# Patient Record
Sex: Female | Born: 1995 | Race: White | Hispanic: No | Smoking: Never smoker
Health system: Southern US, Community
[De-identification: ages and names within clinical notes are randomized; demographics above are authoritative.]

## PROBLEM LIST (undated history)

## (undated) DIAGNOSIS — K219 Gastro-esophageal reflux disease without esophagitis: Secondary | ICD-10-CM

## (undated) DIAGNOSIS — T7840XA Allergy, unspecified, initial encounter: Secondary | ICD-10-CM

## (undated) HISTORY — PX: MOUTH SURGERY: SHX715

## (undated) HISTORY — DX: Allergy, unspecified, initial encounter: T78.40XA

## (undated) HISTORY — PX: NO PAST SURGERIES: SHX2092

## (undated) HISTORY — DX: Gastro-esophageal reflux disease without esophagitis: K21.9

---
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
## 2018-01-02 IMAGING — US US ABDOMEN LIMITED
1 series · 14 of 25 positions shown · non-contrast
Comparison: None.

CLINICAL DATA: Right upper quadrant pain

EXAM:
US ABDOMEN LIMITED - RIGHT UPPER QUADRANT

[Series 1: us abdomen limited · 0.25mm/px · 14 of 38 slices shown]
[im 1/38]
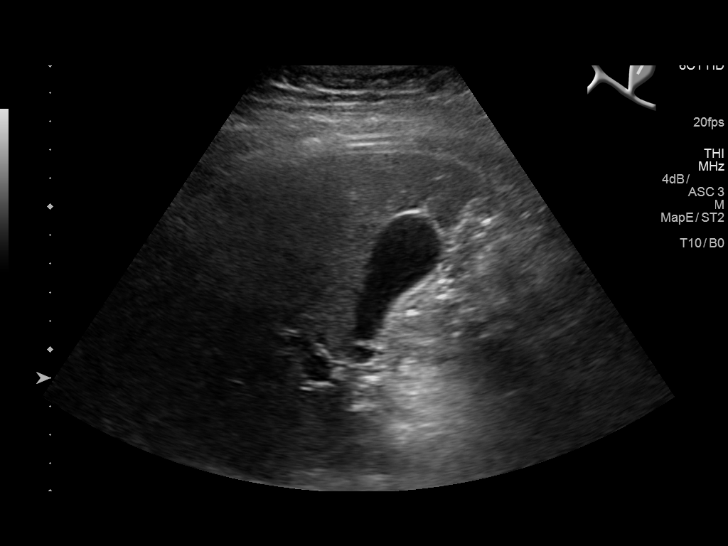
[im 4/38]
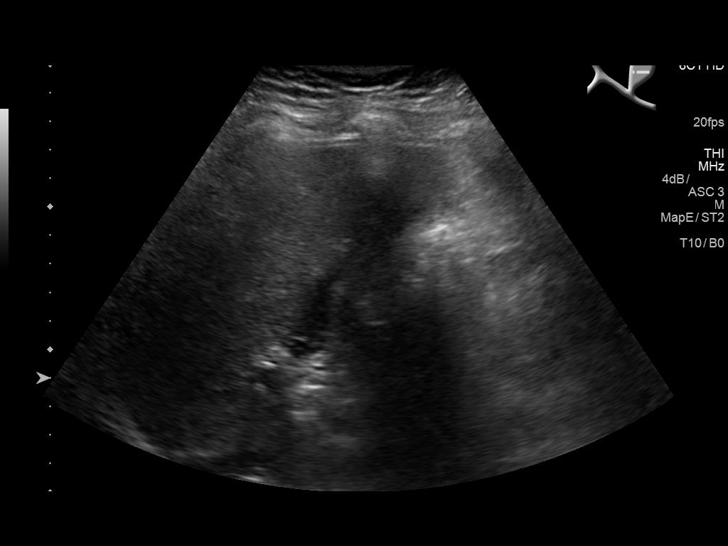
[im 7/38]
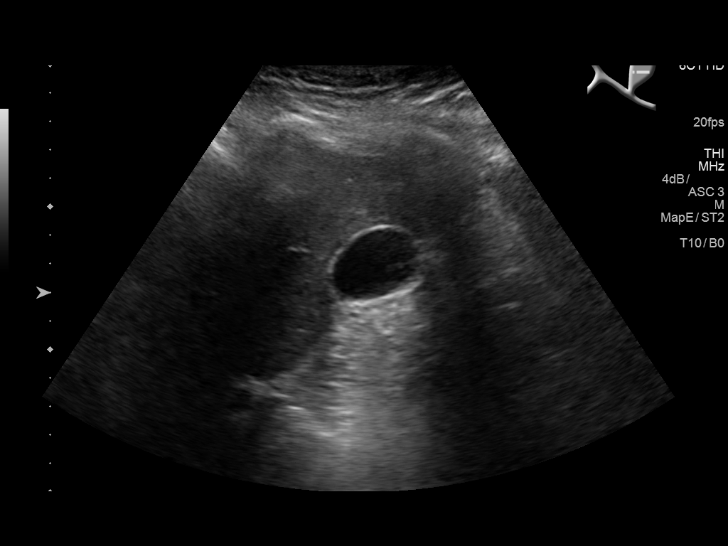
[im 10/38]
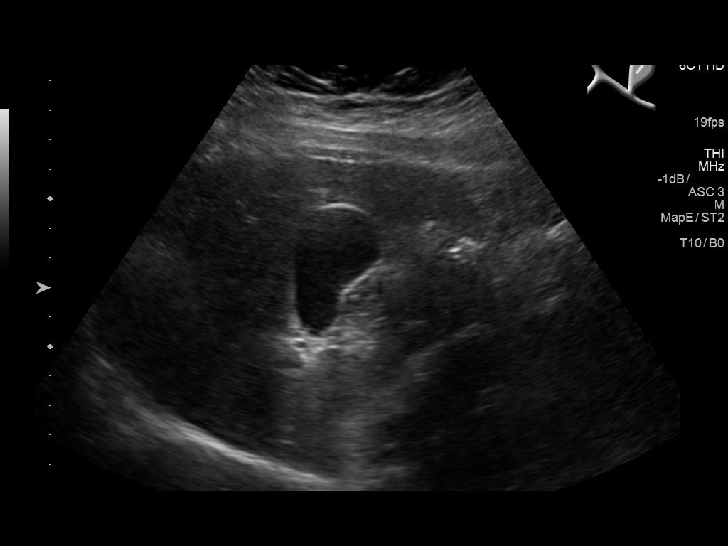
[im 13/38]
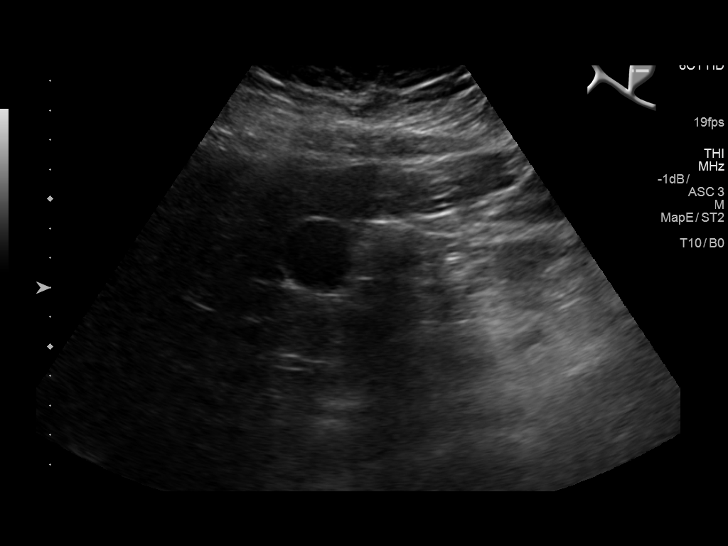
[im 14/38]
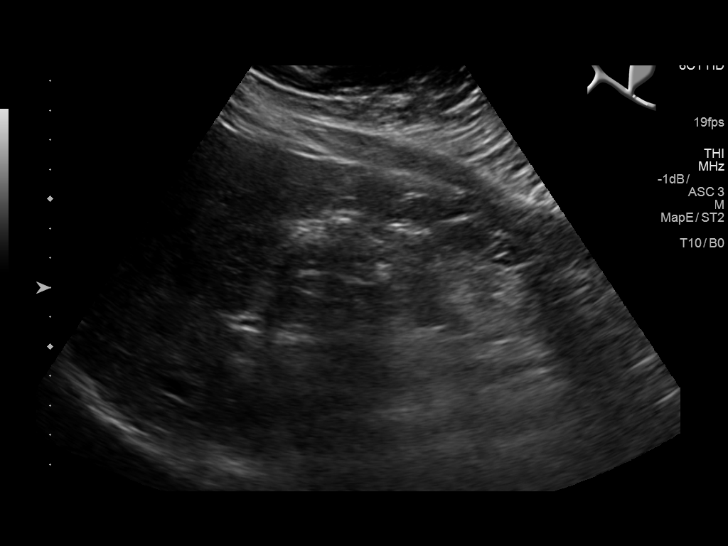
[im 17/38]
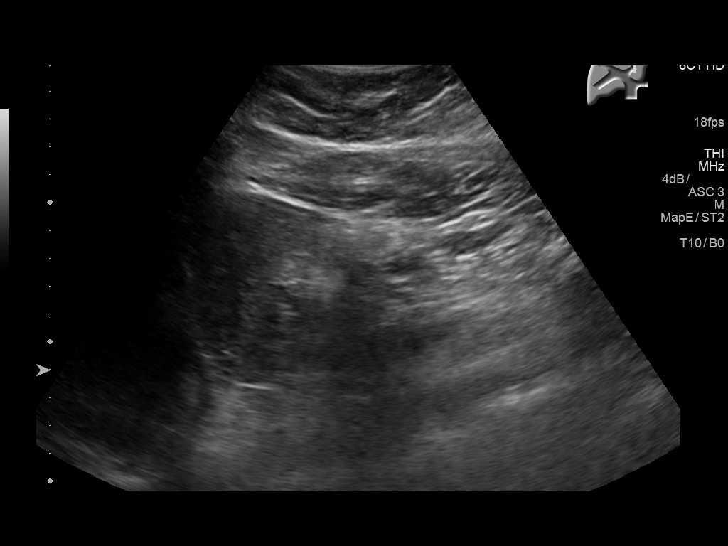
[im 21/38]
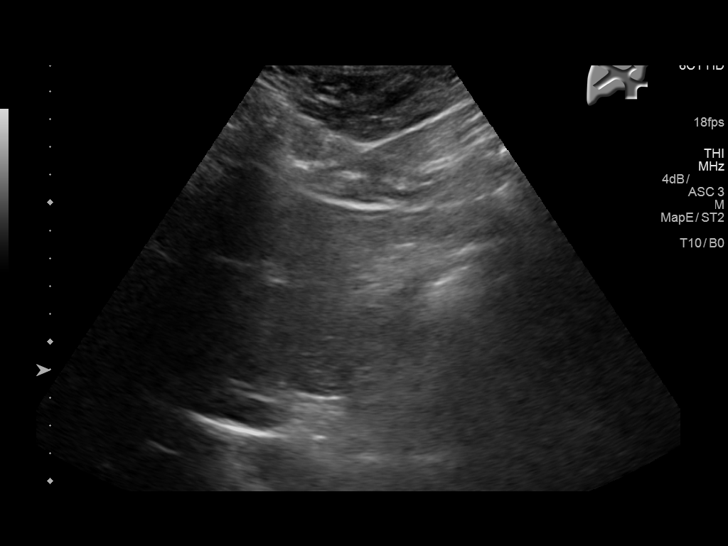
[im 24/38]
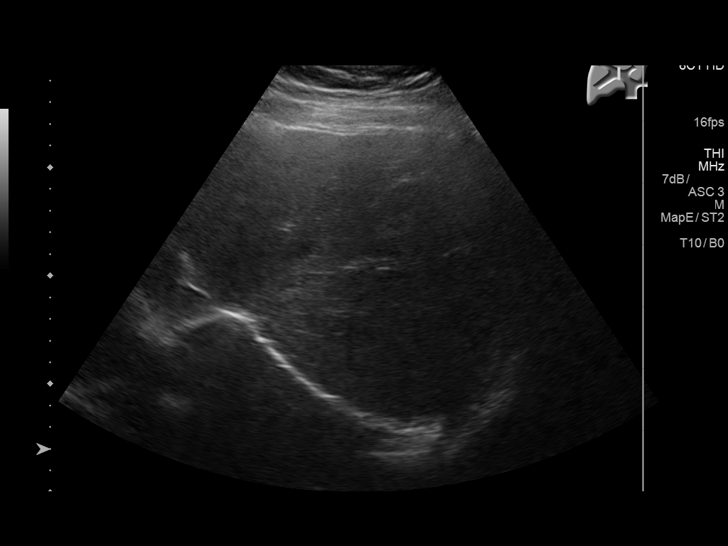
[im 25/38]
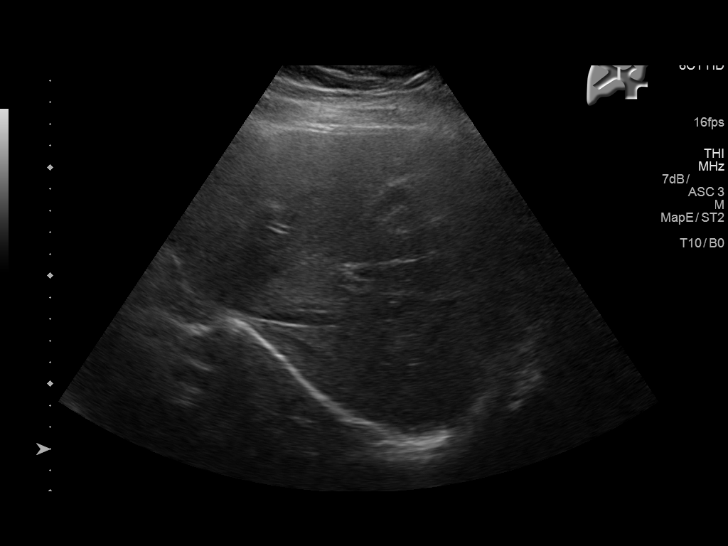
[im 28/38]
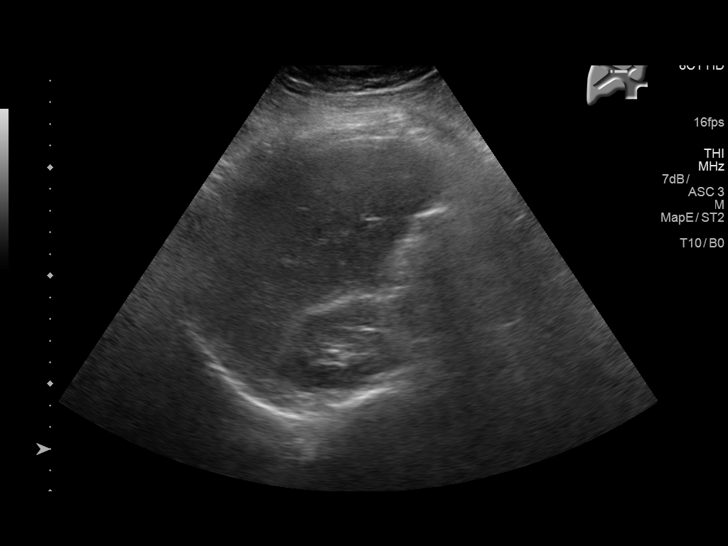
[im 31/38]
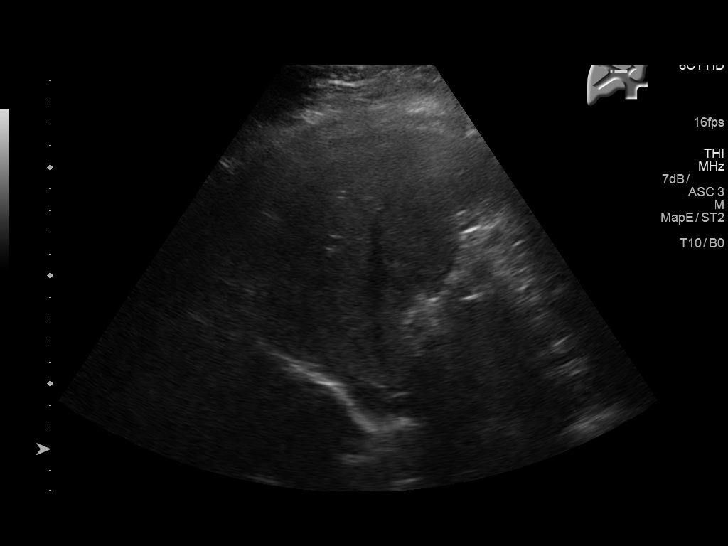
[im 34/38]
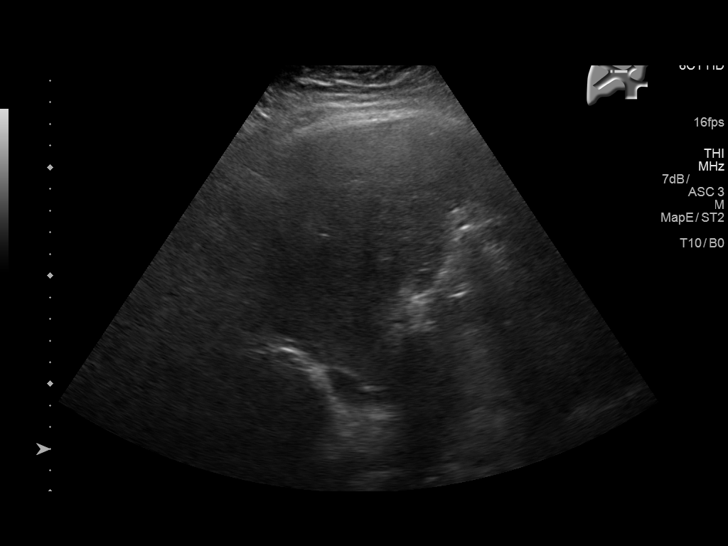
[im 38/38]
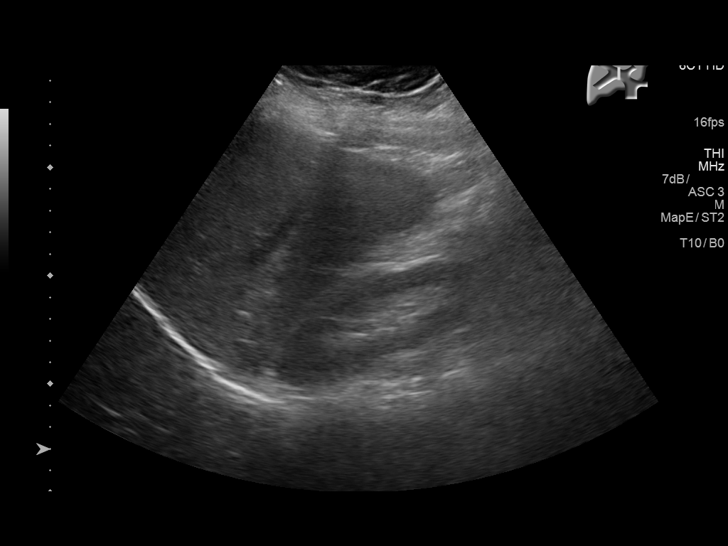

[14 of 25 positions shown; findings below may reference images not displayed]

FINDINGS: Gallbladder:

No gallstones or wall thickening visualized. No sonographic Murphy
sign noted.

Common bile duct:

Diameter: 3 mm

Liver:

Diffusely increased in echogenicity without focal mass.
IMPRESSION: Diffuse hepatic steatosis

Normal gallbladder and biliary tree.

## 2018-11-30 NOTE — L&D Delivery Note (Signed)
Date of delivery: 11/21/2019 Estimated Date of Delivery: 11/24/19 Patient's last menstrual period was 02/17/2019. EGA: [redacted]w[redacted]d  Delivery Note At 2:09 AM a viable and healthy female was delivered via Vaginal, Spontaneous.  Presentation: vertex; Position: Left,, Occiput,, Anterior;  Mom presented to L&D with labor, epidual placed, augmented with pitocin, AROMd for clear fluid. Progressed to complete, second stage: 2hrs, with delivery of fetal head with restitution to LOT.   Shoulder dystocia encountered, detailed below. Nuchal reduced,  Baby kept at perineum and then shortly after cord was clamped/cut and handed to peds for evaluation. Placenta spontaneously delivered, intact.   IV pitocin given for hemorrhage prophylaxis. No lacerations. A segment of hymen was hemostatic and dangling out of the vagina.  This was cut and a figure of eight was placed for hemostasis.  Delivery of the head: 11/21/2019  2:08 AM Tight nuchal, not reduced. First maneuver: 11/21/2019  2:08 AM, [place] Second maneuver: downward traction of head, then upward traction of head   Third maneuver: manipulation of posterior arm. Left arm rotated clockwise, which resulted in movement or anterior shoulder   APGAR: 5, 8; weight 9 lb 7.7 oz (4300 g).   Placenta status: spontaneous, intact.   Cord:  with the following complications: .  Cord pH: 7.35  Anesthesia: Epidural Episiotomy: None Lacerations: None Suture Repair: 3.0 vicryl Est. Blood Loss (mL): 65   We sang happy birthday to baby Ellen Castillo.   Mom to postpartum.  Baby to Couplet care / Skin to Skin.  Ellen Castillo 11/21/2019, 3:55 AM

## 2019-05-24 DIAGNOSIS — Z3403 Encounter for supervision of normal first pregnancy, third trimester: Secondary | ICD-10-CM | POA: Insufficient documentation

## 2019-05-30 LAB — OB RESULTS CONSOLE VARICELLA ZOSTER ANTIBODY, IGG: Varicella: IMMUNE

## 2019-05-30 LAB — OB RESULTS CONSOLE HEPATITIS B SURFACE ANTIGEN: Hepatitis B Surface Ag: NEGATIVE

## 2019-05-30 LAB — OB RESULTS CONSOLE RUBELLA ANTIBODY, IGM: Rubella: IMMUNE

## 2019-11-01 LAB — OB RESULTS CONSOLE GBS: GBS: NEGATIVE

## 2019-11-01 LAB — OB RESULTS CONSOLE RPR: RPR: NONREACTIVE

## 2019-11-01 LAB — OB RESULTS CONSOLE GC/CHLAMYDIA
Chlamydia: NEGATIVE
Gonorrhea: NEGATIVE

## 2019-11-01 LAB — OB RESULTS CONSOLE HIV ANTIBODY (ROUTINE TESTING): HIV: NONREACTIVE

## 2019-11-16 ENCOUNTER — Other Ambulatory Visit: Payer: Self-pay | Admitting: Certified Nurse Midwife

## 2019-11-16 ENCOUNTER — Encounter: Payer: Self-pay | Admitting: Certified Nurse Midwife

## 2019-11-16 NOTE — Progress Notes (Signed)
  Ellen Castillo is a 23 y.o. G1P0 female dated by LMP c/w [redacted]w[redacted]d ultrasound on 05/24/2019.    Pregnancy Issues: 1. Rh negative, s/p RhoGAM 08/31/2019 2. S>D at 24 weeks, growth ultrasound 11/01/2019 EFW 3306g 7lb5oz 73% 3. Elective induction  Prenatal care site: [facility]   Prenatal Labs: Blood type/Rh A neg  Antibody screen neg  Rubella Immune  Varicella Immune  RPR NR  HBsAg Neg  HIV NR  GC neg  Chlamydia neg  Genetic screening Negative, XY  1 hour GTT 100  3 hour GTT n/a  GBS negative   Post Partum Planning: - Infant feeding: breast - Contraception: OCPs

## 2019-11-20 ENCOUNTER — Inpatient Hospital Stay: Payer: No Typology Code available for payment source | Admitting: Registered Nurse

## 2019-11-20 ENCOUNTER — Inpatient Hospital Stay
Admission: EM | Admit: 2019-11-20 | Discharge: 2019-11-22 | DRG: 807 | Disposition: A | Discharge status: Home / Self Care | Payer: No Typology Code available for payment source | Attending: Obstetrics & Gynecology | Admitting: Obstetrics & Gynecology

## 2019-11-20 ENCOUNTER — Other Ambulatory Visit: Payer: Self-pay

## 2019-11-20 ENCOUNTER — Encounter: Payer: Self-pay | Admitting: Obstetrics & Gynecology

## 2019-11-20 DIAGNOSIS — O26893 Other specified pregnancy related conditions, third trimester: Secondary | ICD-10-CM | POA: Diagnosis present

## 2019-11-20 DIAGNOSIS — O3663X0 Maternal care for excessive fetal growth, third trimester, not applicable or unspecified: Secondary | ICD-10-CM | POA: Diagnosis present

## 2019-11-20 DIAGNOSIS — Z3A39 39 weeks gestation of pregnancy: Secondary | ICD-10-CM | POA: Diagnosis not present

## 2019-11-20 DIAGNOSIS — Z20828 Contact with and (suspected) exposure to other viral communicable diseases: Secondary | ICD-10-CM | POA: Diagnosis present

## 2019-11-20 LAB — CBC
HCT: 39.4 % (ref 36.0–46.0)
Hemoglobin: 13.8 g/dL (ref 12.0–15.0)
MCH: 31.7 pg (ref 26.0–34.0)
MCHC: 35 g/dL (ref 30.0–36.0)
MCV: 90.4 fL (ref 80.0–100.0)
Platelets: 226 10*3/uL (ref 150–400)
RBC: 4.36 MIL/uL (ref 3.87–5.11)
RDW: 12.4 % (ref 11.5–15.5)
WBC: 11.4 10*3/uL — ABNORMAL HIGH (ref 4.0–10.5)
nRBC: 0 % (ref 0.0–0.2)

## 2019-11-20 LAB — TYPE AND SCREEN
ABO/RH(D): A NEG
Antibody Screen: NEGATIVE

## 2019-11-20 LAB — RESPIRATORY PANEL BY RT PCR (FLU A&B, COVID)
Influenza A by PCR: NEGATIVE
Influenza B by PCR: NEGATIVE
SARS Coronavirus 2 by RT PCR: NEGATIVE

## 2019-11-20 LAB — ABO/RH: ABO/RH(D): A NEG

## 2019-11-20 MED ORDER — OXYTOCIN 40 UNITS IN NORMAL SALINE INFUSION - SIMPLE MED
2.5000 [IU]/h | INTRAVENOUS | Status: DC
  Filled 2019-11-20: qty 1000

## 2019-11-20 MED ORDER — FENTANYL 2.5 MCG/ML W/ROPIVACAINE 0.15% IN NS 100 ML EPIDURAL (ARMC)
EPIDURAL | Status: AC
  Filled 2019-11-20: qty 100

## 2019-11-20 MED ORDER — SOD CITRATE-CITRIC ACID 500-334 MG/5ML PO SOLN: 30.0000 mL | ORAL | Status: DC | PRN

## 2019-11-20 MED ORDER — LIDOCAINE HCL (PF) 1 % IJ SOLN: 30.0000 mL | INTRAMUSCULAR | Status: DC | PRN

## 2019-11-20 MED ORDER — BUPIVACAINE HCL (PF) 0.25 % IJ SOLN
INTRAMUSCULAR | Status: DC | PRN
  Administered 2019-11-20: 3 mL via EPIDURAL
  Administered 2019-11-20: 4 mL via EPIDURAL

## 2019-11-20 MED ORDER — MISOPROSTOL 25 MCG QUARTER TABLET: 25.0000 ug | ORAL_TABLET | ORAL | Status: DC | PRN

## 2019-11-20 MED ORDER — TERBUTALINE SULFATE 1 MG/ML IJ SOLN: 0.2500 mg | Freq: Once | INTRAMUSCULAR | Status: DC | PRN

## 2019-11-20 MED ORDER — LACTATED RINGERS IV SOLN
500.0000 mL | INTRAVENOUS | Status: DC | PRN
  Administered 2019-11-20: 500 mL via INTRAVENOUS

## 2019-11-20 MED ORDER — LACTATED RINGERS IV SOLN: INTRAVENOUS | Status: DC

## 2019-11-20 MED ORDER — EPHEDRINE 5 MG/ML INJ
INTRAVENOUS | Status: AC
  Filled 2019-11-20: qty 4

## 2019-11-20 MED ORDER — EPHEDRINE 5 MG/ML INJ
5.0000 mg | Freq: Once | INTRAVENOUS | Status: AC
  Administered 2019-11-20: 15:00:00 5 mg via INTRAVENOUS

## 2019-11-20 MED ORDER — ACETAMINOPHEN 325 MG PO TABS
650.0000 mg | ORAL_TABLET | ORAL | Status: DC | PRN
  Administered 2019-11-20: 650 mg via ORAL
  Filled 2019-11-20: qty 2

## 2019-11-20 MED ORDER — OXYTOCIN 10 UNIT/ML IJ SOLN
INTRAMUSCULAR | Status: AC
  Filled 2019-11-20: qty 2

## 2019-11-20 MED ORDER — LIDOCAINE HCL (PF) 1 % IJ SOLN
INTRAMUSCULAR | Status: DC | PRN
  Administered 2019-11-20: 3 mL

## 2019-11-20 MED ORDER — FENTANYL 2.5 MCG/ML W/ROPIVACAINE 0.15% IN NS 100 ML EPIDURAL (ARMC)
EPIDURAL | Status: DC | PRN
  Administered 2019-11-20: 12 mL/h via EPIDURAL

## 2019-11-20 MED ORDER — ONDANSETRON HCL 4 MG/2ML IJ SOLN: 4.0000 mg | Freq: Four times a day (QID) | INTRAMUSCULAR | Status: DC | PRN

## 2019-11-20 MED ORDER — OXYTOCIN 40 UNITS IN NORMAL SALINE INFUSION - SIMPLE MED
1.0000 m[IU]/min | INTRAVENOUS | Status: DC
  Administered 2019-11-20: 2 m[IU]/min via INTRAVENOUS

## 2019-11-20 MED ORDER — BUTORPHANOL TARTRATE 1 MG/ML IJ SOLN: 1.0000 mg | INTRAMUSCULAR | Status: DC | PRN

## 2019-11-20 MED ORDER — LIDOCAINE HCL (PF) 1 % IJ SOLN
INTRAMUSCULAR | Status: AC
  Filled 2019-11-20: qty 30

## 2019-11-20 MED ORDER — AMMONIA AROMATIC IN INHA
RESPIRATORY_TRACT | Status: AC
  Filled 2019-11-20: qty 10

## 2019-11-20 MED ORDER — MISOPROSTOL 200 MCG PO TABS
ORAL_TABLET | ORAL | Status: AC
  Filled 2019-11-20: qty 4

## 2019-11-20 MED ORDER — LIDOCAINE-EPINEPHRINE (PF) 1.5 %-1:200000 IJ SOLN
INTRAMUSCULAR | Status: DC | PRN
  Administered 2019-11-20: 3 mL via PERINEURAL

## 2019-11-20 MED ORDER — OXYTOCIN BOLUS FROM INFUSION
500.0000 mL | Freq: Once | INTRAVENOUS | Status: AC
  Administered 2019-11-21: 02:00:00 500 mL via INTRAVENOUS

## 2019-11-20 NOTE — OB Triage Note (Signed)
Pt is a 23 Y.o. G1P0 that presents from ED with c/o strong ctx since 10pm last night. Pt states she has lost her mucous plug and had some spotting overnight. Pt denies LOF and states positive FM. Monitors applied and initial FHT 150. Will continue to monitor.

## 2019-11-20 NOTE — Progress Notes (Signed)
MD notified of prolonged deceleration at 1430 and maternal BP 84/50 at 1432 and 94/49 at 1434. MD aware of interventions done (position change and bolus infusing from epidural) and that the anesthesiologist was notified at 1433 and gave order for ephedrine 5 mg to be given. MD notified that ephedrine was administered per order and that FHR is currently reactive.

## 2019-11-20 NOTE — Anesthesia Procedure Notes (Signed)
Epidural Patient location during procedure: OB Start time: 11/20/2019 2:07 PM End time: 11/20/2019 2:15 PM  Staffing Anesthesiologist: Gunnar Fusi, MD Resident/CRNA: Hedda Slade, CRNA Performed: resident/CRNA   Preanesthetic Checklist Completed: patient identified, IV checked, site marked, risks and benefits discussed, surgical consent, monitors and equipment checked, pre-op evaluation and timeout performed  Epidural Patient position: sitting Prep: ChloraPrep Patient monitoring: heart rate, continuous pulse ox and blood pressure Approach: midline Location: L3-L4 Injection technique: LOR saline  Needle:  Needle type: Tuohy  Needle gauge: 17 G Needle length: 9 cm and 9 Needle insertion depth: 8 cm Catheter type: closed end flexible Catheter size: 19 Gauge Catheter at skin depth: 13 cm Test dose: negative and 1.5% lidocaine with Epi 1:200 K  Assessment Events: blood not aspirated, injection not painful, no injection resistance, no paresthesia and negative IV test  Additional Notes 1 attempt Pt. Evaluated and documentation done after procedure finished. Patient identified. Risks/Benefits/Options discussed with patient including but not limited to bleeding, infection, nerve damage, paralysis, failed block, incomplete pain control, headache, blood pressure changes, nausea, vomiting, reactions to medication both or allergic, itching and postpartum back pain. Confirmed with bedside nurse the patient's most recent platelet count. Confirmed with patient that they are not currently taking any anticoagulation, have any bleeding history or any family history of bleeding disorders. Patient expressed understanding and wished to proceed. All questions were answered. Sterile technique was used throughout the entire procedure. Please see nursing notes for vital signs. Test dose was given through epidural catheter and negative prior to continuing to dose epidural or start infusion. Warning  signs of high block given to the patient including shortness of breath, tingling/numbness in hands, complete motor block, or any concerning symptoms with instructions to call for help. Patient was given instructions on fall risk and not to get out of bed. All questions and concerns addressed with instructions to call with any issues or inadequate analgesia.   Patient tolerated the insertion well without immediate complications.Reason for block:procedure for pain

## 2019-11-20 NOTE — Anesthesia Preprocedure Evaluation (Signed)
Anesthesia Evaluation  Patient identified by MRN, date of birth, ID band Patient awake    Reviewed: Allergy & Precautions, H&P , NPO status , Patient's Chart, lab work & pertinent test results  Airway Mallampati: II  TM Distance: >3 FB Neck ROM: full    Dental no notable dental hx.    Pulmonary    Pulmonary exam normal        Cardiovascular Normal cardiovascular exam     Neuro/Psych    GI/Hepatic GERD  Medicated and Controlled,  Endo/Other    Renal/GU      Musculoskeletal   Abdominal   Peds  Hematology   Anesthesia Other Findings   Reproductive/Obstetrics (+) Pregnancy                             Anesthesia Physical Anesthesia Plan  ASA: II  Anesthesia Plan:    Post-op Pain Management:    Induction:   PONV Risk Score and Plan:   Airway Management Planned:   Additional Equipment:   Intra-op Plan:   Post-operative Plan:   Informed Consent: I have reviewed the patients History and Physical, chart, labs and discussed the procedure including the risks, benefits and alternatives for the proposed anesthesia with the patient or authorized representative who has indicated his/her understanding and acceptance.     Dental Advisory Given  Plan Discussed with: Anesthesiologist and CRNA  Anesthesia Plan Comments:         Anesthesia Quick Evaluation

## 2019-11-21 ENCOUNTER — Encounter: Payer: Self-pay | Admitting: Obstetrics & Gynecology

## 2019-11-21 LAB — CBC
HCT: 35.8 % — ABNORMAL LOW (ref 36.0–46.0)
Hemoglobin: 12.5 g/dL (ref 12.0–15.0)
MCH: 32.1 pg (ref 26.0–34.0)
MCHC: 34.9 g/dL (ref 30.0–36.0)
MCV: 91.8 fL (ref 80.0–100.0)
Platelets: 186 10*3/uL (ref 150–400)
RBC: 3.9 MIL/uL (ref 3.87–5.11)
RDW: 12.6 % (ref 11.5–15.5)
WBC: 20.2 10*3/uL — ABNORMAL HIGH (ref 4.0–10.5)
nRBC: 0 % (ref 0.0–0.2)

## 2019-11-21 LAB — RPR: RPR Ser Ql: NONREACTIVE

## 2019-11-21 MED ORDER — SIMETHICONE 80 MG PO CHEW: 80.0000 mg | CHEWABLE_TABLET | ORAL | Status: DC | PRN

## 2019-11-21 MED ORDER — IBUPROFEN 600 MG PO TABS
600.0000 mg | ORAL_TABLET | Freq: Four times a day (QID) | ORAL | Status: DC
  Administered 2019-11-21 – 2019-11-22 (×6): 600 mg via ORAL
  Filled 2019-11-21 (×6): qty 1

## 2019-11-21 MED ORDER — VARICELLA VIRUS VACCINE LIVE 1350 PFU/0.5ML IJ SUSR
0.5000 mL | Freq: Once | INTRAMUSCULAR | Status: DC
  Filled 2019-11-21: qty 0.5

## 2019-11-21 MED ORDER — DIPHENHYDRAMINE HCL 25 MG PO CAPS: 25.0000 mg | ORAL_CAPSULE | Freq: Four times a day (QID) | ORAL | Status: DC | PRN

## 2019-11-21 MED ORDER — BENZOCAINE-MENTHOL 20-0.5 % EX AERO
1.0000 "application " | INHALATION_SPRAY | CUTANEOUS | Status: DC | PRN
  Administered 2019-11-21 – 2019-11-22 (×2): 1 via TOPICAL
  Filled 2019-11-21 (×2): qty 56

## 2019-11-21 MED ORDER — DOCUSATE SODIUM 100 MG PO CAPS
100.0000 mg | ORAL_CAPSULE | Freq: Two times a day (BID) | ORAL | Status: DC
  Administered 2019-11-21 – 2019-11-22 (×2): 100 mg via ORAL
  Filled 2019-11-21 (×2): qty 1

## 2019-11-21 MED ORDER — FENTANYL 2.5 MCG/ML W/ROPIVACAINE 0.15% IN NS 100 ML EPIDURAL (ARMC)
EPIDURAL | Status: AC
  Filled 2019-11-21: qty 100

## 2019-11-21 MED ORDER — PRENATAL MULTIVITAMIN CH
1.0000 | ORAL_TABLET | Freq: Every day | ORAL | Status: DC
  Filled 2019-11-21: qty 1

## 2019-11-21 MED ORDER — COCONUT OIL OIL
1.0000 "application " | TOPICAL_OIL | Status: DC | PRN
  Administered 2019-11-22: 1 via TOPICAL
  Filled 2019-11-21: qty 120

## 2019-11-21 MED ORDER — ACETAMINOPHEN 500 MG PO TABS
1000.0000 mg | ORAL_TABLET | Freq: Four times a day (QID) | ORAL | Status: DC | PRN
  Administered 2019-11-21 (×2): 1000 mg via ORAL
  Filled 2019-11-21 (×2): qty 2

## 2019-11-21 MED ORDER — ONDANSETRON HCL 4 MG PO TABS: 4.0000 mg | ORAL_TABLET | ORAL | Status: DC | PRN

## 2019-11-21 MED ORDER — ONDANSETRON HCL 4 MG/2ML IJ SOLN: 4.0000 mg | INTRAMUSCULAR | Status: DC | PRN

## 2019-11-21 MED ORDER — WITCH HAZEL-GLYCERIN EX PADS: 1.0000 "application " | MEDICATED_PAD | CUTANEOUS | Status: DC

## 2019-11-21 MED ORDER — OXYTOCIN 40 UNITS IN NORMAL SALINE INFUSION - SIMPLE MED
INTRAVENOUS | Status: AC
  Filled 2019-11-21: qty 1000

## 2019-11-21 MED ORDER — DIBUCAINE (PERIANAL) 1 % EX OINT: 1.0000 "application " | TOPICAL_OINTMENT | CUTANEOUS | Status: DC | PRN

## 2019-11-21 NOTE — Lactation Note (Signed)
This note was copied from a baby's chart. Lactation Consultation Note  Patient Name: Ellen Castillo [id number] Date: 11/21/2019 Reason for consult: Initial assessment;1st time breastfeeding;Term;Other (Comment)(shoulder dystosia )  LC walked into the room and Mom was in the bed nursing baby at the left breast. FOB was sleeping in the recliner. [place] asked how things were going so far with breastfeeding. Mom reported that things were going well. She said baby was nursing for the second time since birth. LC observed the latch/ technique and praised Mom for her efforts.  Mom asked great questions as she is a first time mom. Breast feeding basics were covered as well as typical behaviors in the first day of life, second day of life, and cluster feeding.   [facility] intern stayed to cover any other questions or concerns Mom had in regards to breastfeeding. Mom had additional questions about pumping (when to) and offering bottles and pacifier. [facility] intern encouraged Mom to make sure breastfeeding is well established before offering artifical nipples.   As far as pumping, Mom reported that she won't be returning to work any time soon but wanted to know when pumping is appropriate. Unless necessary, pumping can be delayed until breastfeeding is well established; however, when pumping always offer baby the breast first then pump. When pumping for excess or storage it helps if you pump on a schedule to get the body in a rhythm. Mom was praised for efforts and was encouraged to call out for assistance if needed.  Maternal Data Formula Feeding for Exclusion: No Has patient been taught Hand Expression?: Yes Does the patient have breastfeeding experience prior to this delivery?: No  Feeding Feeding Type: Breast Fed  LATCH Score Latch: Repeated attempts needed to sustain latch, nipple held in mouth throughout feeding, stimulation needed to elicit sucking reflex.  Audible Swallowing: None  Type of Nipple: Everted  at rest and after stimulation  Comfort (Breast/Nipple): Soft / non-tender  Hold (Positioning): No assistance needed to correctly position infant at breast.  LATCH Score: 7  Interventions Interventions: Breast feeding basics reviewed;Assisted with latch;Hand express;Breast compression  Lactation Tools Discussed/Used     Consult Status Consult Status: Follow-up Date: 11/21/19 Follow-up type: In-patient    Lavonia Drafts 11/21/2019, 1:48 PM

## 2019-11-21 NOTE — Anesthesia Postprocedure Evaluation (Deleted)
Anesthesia Post Note  Patient: Ellen Castillo  Procedure(s) Performed: AN AD HOC LABOR EPIDURAL  Patient location during evaluation: Mother Baby Anesthesia Type: Epidural Level of consciousness: awake and alert Pain management: pain level controlled Vital Signs Assessment: post-procedure vital signs reviewed and stable Respiratory status: spontaneous breathing, nonlabored ventilation and respiratory function stable Cardiovascular status: stable Postop Assessment: no headache, no backache and epidural receding Anesthetic complications: no     Last Vitals:  Vitals:   11/21/19 0430 11/21/19 0457  BP:  113/78  Pulse:  (!) 102  Resp:  16  Temp:  37.3 C  SpO2: 96% 95%    Last Pain:  Vitals:   11/21/19 0526  TempSrc:   PainSc: 3                  Caryl Asp

## 2019-11-21 NOTE — Lactation Note (Signed)
This note was copied from a baby's chart. Lactation Consultation Note  Patient Name: Ellen Castillo [id number] Date: 11/21/2019 Reason for consult: Initial assessment;1st time breastfeeding;Term;Other (Comment)(shoulder dystosia )  [place] intern walked in the room and Mom was in bed eating, Dad was in the recliner. Baby was sleeping in the crib. [facility] intern asked how things were going since our last visit. Mom reported things were going well. Baby nursed on both breast for 40 minutes total, 20 minutes each.  [facility] intern encouraged Mom to continue to watch for early feeding cues as baby will be approaching the 24 hour mark soon and will begin cluster feeding.  Mom seemed confident and was encouraged to call out for assistance if needed.   Maternal Data Formula Feeding for Exclusion: No Has patient been taught Hand Expression?: Yes Does the patient have breastfeeding experience prior to this delivery?: No  Feeding Feeding Type: Breast Fed  LATCH Score Latch: Repeated attempts needed to sustain latch, nipple held in mouth throughout feeding, stimulation needed to elicit sucking reflex.  Audible Swallowing: None  Type of Nipple: Everted at rest and after stimulation  Comfort (Breast/Nipple): Soft / non-tender  Hold (Positioning): No assistance needed to correctly position infant at breast.  LATCH Score: 7  Interventions Interventions: Breast feeding basics reviewed;Assisted with latch;Hand express;Breast compression  Lactation Tools Discussed/Used     Consult Status Consult Status: Follow-up Date: 11/21/19 Follow-up type: In-patient    Lavonia Drafts 11/21/2019, 4:02 PM

## 2019-11-22 MED ORDER — IBUPROFEN 600 MG PO TABS: 600.0000 mg | ORAL_TABLET | Freq: Four times a day (QID) | ORAL | 0 refills | Status: DC

## 2019-11-22 MED ORDER — ACETAMINOPHEN 500 MG PO TABS: 1000.0000 mg | ORAL_TABLET | Freq: Four times a day (QID) | ORAL | 0 refills | Status: DC | PRN

## 2019-11-22 NOTE — Progress Notes (Signed)
Discharge order received from doctor. Reviewed discharge instructions and prescriptions with patient and answered all questions. Follow up appointment instructions given. Patient verbalized understanding. ID bands checked. Patient discharged home with infant via wheelchair by nursing/auxillary.    Oaklyn Jakubek Garner, RN  

## 2019-11-22 NOTE — Discharge Summary (Signed)
Obstetrical Discharge Summary  Patient Name: Ellen Castillo DOB: 02/27/1996 MRN: [id number]  Date of Admission: 11/20/2019 Date of Delivery: 11/21/2019 Delivered by: Ranae Plumber MD Date of Discharge: 11/22/2019  Primary OB: [facility]   [id number] last menstrual period was 02/17/2019. EDC Estimated Date of Delivery: 11/24/19 Gestational Age at Delivery: [redacted]w[redacted]d   Antepartum complications:  Admitting Diagnosis: labor Secondary Diagnosis: Patient Active Problem List   Diagnosis Date Noted  . Labor and delivery indication for care or intervention 11/20/2019    Augmentation: AROM and Pitocin Complications: None Intrapartum complications/course:  Delivery Type: spontaneous vaginal delivery shoulder dystocia, macrosomia Anesthesia: epidural Placenta: spontaneous Laceration: none Episiotomy: none Newborn Data: Live born female "Madaline Guthrie" Birth Weight: 9 lb 7.7 oz (4300 g) APGAR: 5, 8  Newborn Delivery   Birth date/time: 11/21/2019 02:09:00 Delivery type: Vaginal, Spontaneous      Postpartum Procedures: none  Post partum course:  Patient had an uncomplicated postpartum course.  By time of discharge on PPD#1, her pain was controlled on oral pain medications; she had appropriate lochia and was ambulating, voiding without difficulty and tolerating regular diet.  She was deemed stable for discharge to home.     Discharge Physical Exam:  BP 122/88 (BP Location: Right Arm)   Pulse 65   Temp 97.6 F (36.4 C) (Oral)   Resp 20   Ht 5\' 4"  (1.626 m)   Wt 93.9 kg   LMP 02/17/2019   SpO2 100% Comment: Room Air  Breastfeeding Unknown   BMI 35.53 kg/m   General: NAD CV: RRR Pulm: CTABL, nl effort ABD: s/nd/nt, fundus firm and below the umbilicus Lochia: moderate DVT Evaluation: LE non-ttp, no evidence of DVT on exam.  Hemoglobin  Date Value Ref Range Status  11/21/2019 12.5 12.0 - 15.0 g/dL Final   HCT  Date Value Ref Range Status  11/21/2019 35.8 (L)  36.0 - 46.0 % Final     Disposition: stable, discharge to home. Baby Feeding: breastmilk  Baby Disposition: home with mom  Rh Immune globulin given: n/a Rubella vaccine given: n/a Flu vaccine given in AP or PP setting: AP  Contraception: OCPs  Prenatal Labs:     Plan:  BERNADENE GARSIDE was discharged to home in good condition. Follow-up appointment with Dr. Karna Dupes in 6 weeks.  Discharge Medications: Allergies as of 11/22/2019   No Known Allergies     Medication List    STOP taking these medications   esomeprazole 40 MG capsule Commonly known as: NEXIUM   fexofenadine-pseudoephedrine 180-240 MG 24 hr tablet Commonly known as: Allegra-D Allergy & Congestion   ondansetron 8 MG disintegrating tablet Commonly known as: Zofran ODT   sucralfate 1 g tablet Commonly known as: Carafate     TAKE these medications   acetaminophen 500 MG tablet Commonly known as: TYLENOL Take 2 tablets (1,000 mg total) by mouth every 6 (six) hours as needed (for pain scale < 4).   ibuprofen 600 MG tablet Commonly known as: ADVIL Take 1 tablet (600 mg total) by mouth every 6 (six) hours. Notes to patient: 11/22/19 @ 6:00 PM   prenatal multivitamin Tabs tablet Take 1 tablet by mouth daily at 12 noon.       Follow-up Information    Ward, 11/24/19, MD. Schedule an appointment as soon as possible for a visit in 6 week(s).   Specialty: Obstetrics and Gynecology Why: Please call to schedule a 6 week postpartum follow up appointment with Dr. Elenora Fender information: [address]  [place] [address] [phone]           Signed: Francetta Found, CNM 11/22/2019 12:57 PM

## 2019-11-22 NOTE — Anesthesia Postprocedure Evaluation (Signed)
Anesthesia Post Note  Patient: Ellen Castillo  Procedure(s) Performed: AN AD HOC LABOR EPIDURAL  Patient location during evaluation: Mother Baby Anesthesia Type: Epidural Level of consciousness: awake and alert Pain management: pain level controlled Vital Signs Assessment: post-procedure vital signs reviewed and stable Respiratory status: spontaneous breathing, nonlabored ventilation and respiratory function stable Cardiovascular status: stable Postop Assessment: no headache, no backache and epidural receding Anesthetic complications: no     Last Vitals:  Vitals:   11/22/19 0008 11/22/19 0746  BP: 119/85 122/88  Pulse: 60 65  Resp: 18 20  Temp: 36.5 C 36.4 C  SpO2: 98% 100%    Last Pain:  Vitals:   11/22/19 0746  TempSrc: Oral  PainSc:                  Hedda Slade

## 2019-11-22 NOTE — Discharge Instructions (Signed)

## 2019-11-22 NOTE — Progress Notes (Signed)
Post Partum Day 1 Subjective: Doing well, no complaints.  Tolerating regular diet, pain with PO meds, voiding and ambulating without difficulty.  No CP SOB Fever,Chills, N/V or leg pain; denies nipple or breast pain, no HA change of vision, RUQ/epigastric pain  Objective: BP 122/88 (BP Location: Right Arm)   Pulse 65   Temp 97.6 F (36.4 C) (Oral)   Resp 20   Ht 5\' 4"  (1.626 m)   Wt 93.9 kg   LMP 02/17/2019   SpO2 100% Comment: Room Air  Breastfeeding Unknown   BMI 35.53 kg/m    Physical Exam:  General: NAD Breasts: soft/nontender CV: RRR Pulm: nl effort, CTABL Abdomen: soft, NT, BS x 4 Perineum: minimal edema, intact, repair of hymen tag hemostatic  Lochia: small Uterine Fundus: fundus firm and 1 fb below umbilicus DVT Evaluation: no cords, ttp LEs   Recent Labs    11/20/19 1017 11/21/19 1331  HGB 13.8 12.5  HCT 39.4 35.8*  WBC 11.4* 20.2*  PLT 226 186    Assessment/Plan: 23 y.o. G1P1001 postpartum day # 1  - Continue routine PP care - Lactation consult.  - Discussed contraceptive options including implant, IUDs hormonal and non-hormonal, injection, pills/ring/patch, condoms, and NFP. Plans OCPs at 6wks PP.  - Immunization status: all Imms up to date    Disposition: Does desire Dc home today.     Francetta Found, CNM 11/22/2019  8:25 AM

## 2019-11-22 NOTE — Lactation Note (Signed)
This note was copied from a baby's chart. Lactation Consultation Note  Patient Name: Ellen Castillo [id number] Date: 11/22/2019 Reason for consult: Follow-up assessment;1st time breastfeeding;Term  LC in to see mom and baby before discharge. Dad was holding and bouncing baby, reporting gas and discomfort since last feeding. Mom reported an increase in feeding frequency overnight, but still unsure if it is enough. Mom has no pain or discomfort noted with breastfeeding, and says baby stays active when at the breast. Parents had questions about initiation of pumping, pacifier use, and signs baby was getting enough. [place] address parents questions: Encouraged mom to focus on feedings at the breast until well established if possible, at least for the next 2-3 weeks before implementing pumping; explaining that this may become overwhelming too soon if not need/necessary. LC discussed risk factors to breastfeeding associated with pacifier introduction/use, encouraging other ways to soothe the baby. LC also reviewed signs of adequate milk transfer- baby's body language, tracking wet/stool diapers, changes in diaper and clothing sizes, and time between feedings. Provided education of breastfeeding basics for the days-weeks to come: reviewed newborn stomach size and growth, growth spurts and cluster feeding, importance of putting breast on cue, tracking of wet/stool diapers, including color of stool. Discussed milk supply and demand, onset of transitional and mature milk, transient nipple pain/soreness, breast fullness, engorgement, and signs of mastitis, and overall breast care. Information given for outpatient lactation consult services and community breastfeeding support resources. Encouraged to call out for any breastfeeding questions, concerns, or support before discharge today.  Maternal Data Formula Feeding for Exclusion: No Has patient been taught Hand Expression?: Yes Does the patient have  breastfeeding experience prior to this delivery?: No  Feeding    LATCH Score                   Interventions Interventions: Breast feeding basics reviewed  Lactation Tools Discussed/Used     Consult Status Consult Status: Complete Date: 11/22/19 Follow-up type: Call as needed    Lavonia Drafts 11/22/2019, 10:23 AM

## 2019-11-22 NOTE — Addendum Note (Signed)
Addendum  created 11/22/19 0926 by Hedda Slade, CRNA   Clinical Note Signed

## 2019-12-08 NOTE — H&P (Signed)
OB History & Physical   History of Present Illness:  Chief Complaint:   HPI:  Ellen Castillo is a 24 y.o. G60P1001 female at [redacted]w[redacted]d dated by LMP. Patient's last menstrual period was 02/17/2019. Estimated Date of Delivery: 11/24/19  She presents to L&D with worsening painful contractions.   +FM, + CTX, no LOF, no VB  Pregnancy Issues: 1. Rh negative, s/p rhogam  Maternal Medical History:   Past Medical History:  Diagnosis Date  . Allergy   . GERD (gastroesophageal reflux disease)     Past Surgical History:  Procedure Laterality Date  . MOUTH SURGERY    . NO PAST SURGERIES      No Known Allergies  Prior to Admission medications   Medication Sig Start Date End Date Taking? Authorizing Provider  Prenatal Vit-Fe Fumarate-FA (PRENATAL MULTIVITAMIN) TABS tablet Take 1 tablet by mouth daily at 12 noon.   Yes [provider]  acetaminophen (TYLENOL) 500 MG tablet Take 2 tablets (1,000 mg total) by mouth every 6 (six) hours as needed (for pain scale < 4). 11/22/19   McVey, Prudencio Pair, CNM  ibuprofen (ADVIL) 600 MG tablet Take 1 tablet (600 mg total) by mouth every 6 (six) hours. 11/22/19   McVey, Prudencio Pair, CNM     Prenatal care site: [facility]   Social History: She  reports that she has never smoked. She has never used smokeless tobacco. She reports that she does not drink alcohol or use drugs.  Family History: family history includes Hyperlipidemia in her father.   Review of Systems: A full review of systems was performed and negative except as noted in the HPI.     Physical Exam:  Vital Signs: BP 122/88 (BP Location: Right Arm)   Pulse 65   Temp 97.6 F (36.4 C) (Oral)   Resp 20   Ht 5\' 4"  (1.626 m)   Wt 93.9 kg   LMP 02/17/2019   SpO2 100% Comment: Room Air  Breastfeeding Unknown   BMI 35.53 kg/m  General: no acute distress.  HEENT: normocephalic, atraumatic Heart: regular rate & rhythm.  No murmurs/rubs/gallops Lungs: clear to  auscultation bilaterally, normal respiratory effort Abdomen: soft, gravid, non-tender;  EFW: 8.5lbs Pelvic:   External: Normal external female genitalia   SVE: 3/90/-2 with change to 5/100/-2 with bulging bag   Extremities: non-tender, symmetric, 2+edema bilaterally.  DTRs: 2+ Neurologic: Alert & oriented x 3.     Pertinent Results:  Prenatal Labs: Blood type/Rh A negative  Antibody screen neg  Rubella Immune  Varicella Immune  RPR NR  HBsAg Neg  HIV NR  GC neg  Chlamydia neg  Genetic screening negative  1 hour GTT 100  3 hour GTT --  GBS negative   FHT: 145 mod + accels no decels TOCO: q2-39min   Cephalic by leopolds  Assessment:  Ellen Castillo is a 24 y.o. G40P1001 female at [redacted]w[redacted]d with labor.   Plan:  1. Admit to Labor & Delivery 2. CBC, T&S, Clrs, IVF 3. GBS  negative 4. Consents obtained. 5. Continuous efm/toco 6. Expectant management 7. category1 tracing 8. Epidural if desired  ----- [redacted]w[redacted]d, MD Attending Obstetrician and Gynecologist [facility], Department of OB/GYN [facility]

## 2020-03-26 ENCOUNTER — Ambulatory Visit (INDEPENDENT_AMBULATORY_CARE_PROVIDER_SITE_OTHER): Payer: No Typology Code available for payment source | Admitting: Podiatry

## 2020-03-26 ENCOUNTER — Other Ambulatory Visit: Payer: Self-pay

## 2020-03-26 DIAGNOSIS — B351 Tinea unguium: Secondary | ICD-10-CM | POA: Diagnosis not present

## 2020-03-27 ENCOUNTER — Encounter: Payer: Self-pay | Admitting: Podiatry

## 2020-03-27 NOTE — Progress Notes (Signed)
Subjective:  Patient ID: Ellen Castillo, female    DOB: [birth year],  MRN: [id number]  Chief Complaint  Patient presents with  . Nail Problem    pt is here for bil fungal nails, which has been going on for aboput 4 years, pt states that it is not painful, but is looking to get it looked at.    24 y.o. female presents with the above complaint.  Patient presents with thickened elongated dystrophic toenails x10.  They are mildly painful in nature.  Patient states been going about 4 years has progressively gotten worse.  She let them go for a while and would like to now discuss various treatment options for this.  She states that she has not tried anything over-the-counter for them.  She denies seeing anyone else for this prior to seeing me.  Her pain scale is 1 out of 10.  It is more dull achy in nature.  She would like to discuss various treatment options for onychomycosis.   Review of Systems: Negative except as noted in the HPI. Denies N/V/F/Ch.  Past Medical History:  Diagnosis Date  . Allergy   . GERD (gastroesophageal reflux disease)     Current Outpatient Medications:  .  acetaminophen (TYLENOL) 500 MG tablet, Take 2 tablets (1,000 mg total) by mouth every 6 (six) hours as needed (for pain scale < 4)., Disp: 30 tablet, Rfl: 0 .  ibuprofen (ADVIL) 600 MG tablet, Take 1 tablet (600 mg total) by mouth every 6 (six) hours., Disp: 30 tablet, Rfl: 0 .  omeprazole (PRILOSEC) 20 MG capsule, Take by mouth., Disp: , Rfl:  .  ondansetron (ZOFRAN-ODT) 4 MG disintegrating tablet, , Disp: , Rfl:  .  Prenat-FeFum-DSS-FA-DHA w/o A (PNV-DHA+DOCUSATE) 27-1.25-300 MG CAPS, Take by mouth., Disp: , Rfl:  .  Prenatal Vit-Fe Fumarate-FA (PRENATAL MULTIVITAMIN) TABS tablet, Take 1 tablet by mouth daily at 12 noon., Disp: , Rfl:  .  pyridOXINE (VITAMIN B-6) 25 MG tablet, Take by mouth., Disp: , Rfl:  .  sertraline (ZOLOFT) 50 MG tablet, Take 50 mg by mouth daily., Disp: , Rfl:  .  simethicone (MYLICON) 80  MG chewable tablet, Chew by mouth., Disp: , Rfl:  .  terconazole (TERAZOL 7) 0.4 % vaginal cream, Place vaginally., Disp: , Rfl:   Social History   Tobacco Use  Smoking Status Never Smoker  Smokeless Tobacco Never Used    No Known Allergies Objective:  There were no vitals filed for this visit. There is no height or weight on file to calculate BMI. Constitutional Well developed. Well nourished.  Vascular Dorsalis pedis pulses palpable bilaterally. Posterior tibial pulses palpable bilaterally. Capillary refill normal to all digits.  No cyanosis or clubbing noted. Pedal hair growth normal.  Neurologic Normal speech. Oriented to person, place, and time. Epicritic sensation to light touch grossly present bilaterally.  Dermatologic  thickened elongated dystrophic mycotic mildly painful toenails x10 Skin within normal limits.  No skin lesions.  Orthopedic: Normal joint ROM without pain or crepitus bilaterally. No visible deformities. No bony tenderness.   Radiographs: None Assessment:   1. Nail fungus   2. Onychomycosis due to dermatophyte    Plan:  Patient was evaluated and treated and all questions answered.  Bilateral onychomycosis of all the digits x10 -Educated the patient on the etiology of onychomycosis and various treatment options associated with improving the fungal load.  I explained to the patient that there is 3 treatment options available to treat the onychomycosis including topical, p.o.,  laser treatment.  Patient has elected to undergo laser therapy to help improve the nail fungus.  I discussed with the patient that laser therapy requires about 6-7 treatments to help completely resolve the nail fungus.  Patient states understanding would like to proceed with a laser therapy treatment for onychomycosis. -She will be scheduled to see Morrie Sheldon for further treatment. -I will see her back as needed.   No follow-ups on file.

## 2020-03-29 ENCOUNTER — Ambulatory Visit: Payer: No Typology Code available for payment source | Admitting: Podiatry

## 2020-03-29 ENCOUNTER — Other Ambulatory Visit: Payer: Self-pay

## 2020-03-29 DIAGNOSIS — B351 Tinea unguium: Secondary | ICD-10-CM

## 2020-03-29 NOTE — Patient Instructions (Signed)

## 2020-03-29 NOTE — Progress Notes (Signed)
Patient presents today for the 1st laser treatment. Diagnosed with mycotic nail infection by Dr. Allena Katz.   Toenail most affected are all ten nails.  All other systems are negative.  Nails were filed thin. Laser therapy was administered to 1-5 toenails bilateral and patient tolerated the treatment well. All safety precautions were in place.   She is breastfeeding and no other treatment options were available at this time.  Follow up in 4 weeks for laser # 2.  Picture of nails taken today to document visual progress

## 2020-04-26 ENCOUNTER — Ambulatory Visit: Payer: Self-pay | Admitting: Podiatry

## 2020-04-26 ENCOUNTER — Other Ambulatory Visit: Payer: Self-pay

## 2020-04-26 DIAGNOSIS — M79676 Pain in unspecified toe(s): Secondary | ICD-10-CM

## 2020-04-26 DIAGNOSIS — B351 Tinea unguium: Secondary | ICD-10-CM

## 2020-04-26 NOTE — Progress Notes (Signed)
Patient presents today for the 2nd laser treatment. Diagnosed with mycotic nail infection by Dr. Allena Katz.   Toenail most affected are all ten nails. The hallux nails are much improved.  All other systems are negative.  Nails were filed thin. Laser therapy was administered to 1-5 toenails bilateral and patient tolerated the treatment well. All safety precautions were in place.   She is breastfeeding and no other treatment options were available at this time.  Follow up in 4 weeks for laser # 3.

## 2020-05-27 ENCOUNTER — Ambulatory Visit (INDEPENDENT_AMBULATORY_CARE_PROVIDER_SITE_OTHER): Payer: No Typology Code available for payment source | Admitting: Podiatry

## 2020-05-27 ENCOUNTER — Other Ambulatory Visit: Payer: Self-pay

## 2020-05-27 DIAGNOSIS — B351 Tinea unguium: Secondary | ICD-10-CM

## 2020-05-27 NOTE — Progress Notes (Signed)
Patient presents today for the 3rd laser treatment. Diagnosed with mycotic nail infection by Dr. Allena Katz.   Toenail most affected are all ten nails. The hallux nails are improving, but slowly.  All other systems are negative.  Nails were filed thin. Laser therapy was administered to 1-5 toenails bilateral and patient tolerated the treatment well. All safety precautions were in place.   She is breastfeeding and no other treatment options were available at this time.  Follow up in 6 weeks for laser # 4.

## 2020-06-04 DIAGNOSIS — M9905 Segmental and somatic dysfunction of pelvic region: Secondary | ICD-10-CM | POA: Diagnosis not present

## 2020-06-04 DIAGNOSIS — M955 Acquired deformity of pelvis: Secondary | ICD-10-CM | POA: Diagnosis not present

## 2020-06-04 DIAGNOSIS — M9902 Segmental and somatic dysfunction of thoracic region: Secondary | ICD-10-CM | POA: Diagnosis not present

## 2020-06-04 DIAGNOSIS — M62830 Muscle spasm of back: Secondary | ICD-10-CM | POA: Diagnosis not present

## 2020-06-04 DIAGNOSIS — M9903 Segmental and somatic dysfunction of lumbar region: Secondary | ICD-10-CM | POA: Diagnosis not present

## 2020-06-04 DIAGNOSIS — M5416 Radiculopathy, lumbar region: Secondary | ICD-10-CM | POA: Diagnosis not present

## 2020-06-21 DIAGNOSIS — Z113 Encounter for screening for infections with a predominantly sexual mode of transmission: Secondary | ICD-10-CM | POA: Diagnosis not present

## 2020-06-21 DIAGNOSIS — Z975 Presence of (intrauterine) contraceptive device: Secondary | ICD-10-CM | POA: Diagnosis not present

## 2020-06-21 DIAGNOSIS — N921 Excessive and frequent menstruation with irregular cycle: Secondary | ICD-10-CM | POA: Diagnosis not present

## 2020-06-21 DIAGNOSIS — N930 Postcoital and contact bleeding: Secondary | ICD-10-CM | POA: Diagnosis not present

## 2020-07-17 DIAGNOSIS — M62830 Muscle spasm of back: Secondary | ICD-10-CM | POA: Diagnosis not present

## 2020-07-17 DIAGNOSIS — M5416 Radiculopathy, lumbar region: Secondary | ICD-10-CM | POA: Diagnosis not present

## 2020-07-17 DIAGNOSIS — M9905 Segmental and somatic dysfunction of pelvic region: Secondary | ICD-10-CM | POA: Diagnosis not present

## 2020-07-17 DIAGNOSIS — M955 Acquired deformity of pelvis: Secondary | ICD-10-CM | POA: Diagnosis not present

## 2020-07-17 DIAGNOSIS — M9903 Segmental and somatic dysfunction of lumbar region: Secondary | ICD-10-CM | POA: Diagnosis not present

## 2020-07-17 DIAGNOSIS — M9902 Segmental and somatic dysfunction of thoracic region: Secondary | ICD-10-CM | POA: Diagnosis not present

## 2020-07-22 ENCOUNTER — Other Ambulatory Visit: Payer: Self-pay

## 2020-07-22 ENCOUNTER — Ambulatory Visit (INDEPENDENT_AMBULATORY_CARE_PROVIDER_SITE_OTHER): Payer: No Typology Code available for payment source | Admitting: *Deleted

## 2020-07-22 DIAGNOSIS — B351 Tinea unguium: Secondary | ICD-10-CM

## 2020-07-22 NOTE — Progress Notes (Signed)
Patient presents today for the 4th laser treatment. Diagnosed with mycotic nail infection by Dr. Allena Katz.   Toenail most affected are all ten nails. She feels that they have improved quite a bit, but they are still growing super slow. The hallux nails have almost completely cleared.  All other systems are negative.  Nails were filed thin. Laser therapy was administered to 1-5 toenails bilateral and patient tolerated the treatment well. All safety precautions were in place.   She is breastfeeding and no other treatment options were available at this time.  Follow up in 6 weeks for laser # 5.

## 2020-08-21 DIAGNOSIS — M5416 Radiculopathy, lumbar region: Secondary | ICD-10-CM | POA: Diagnosis not present

## 2020-08-21 DIAGNOSIS — M9902 Segmental and somatic dysfunction of thoracic region: Secondary | ICD-10-CM | POA: Diagnosis not present

## 2020-08-21 DIAGNOSIS — M62830 Muscle spasm of back: Secondary | ICD-10-CM | POA: Diagnosis not present

## 2020-08-21 DIAGNOSIS — M955 Acquired deformity of pelvis: Secondary | ICD-10-CM | POA: Diagnosis not present

## 2020-08-21 DIAGNOSIS — M9903 Segmental and somatic dysfunction of lumbar region: Secondary | ICD-10-CM | POA: Diagnosis not present

## 2020-08-21 DIAGNOSIS — M9905 Segmental and somatic dysfunction of pelvic region: Secondary | ICD-10-CM | POA: Diagnosis not present

## 2020-09-13 ENCOUNTER — Ambulatory Visit: Payer: No Typology Code available for payment source | Admitting: *Deleted

## 2020-09-13 ENCOUNTER — Other Ambulatory Visit: Payer: Self-pay

## 2020-09-13 DIAGNOSIS — B351 Tinea unguium: Secondary | ICD-10-CM

## 2020-09-13 NOTE — Progress Notes (Signed)
Patient presents today for the 5th laser treatment. Diagnosed with mycotic nail infection by Dr. Allena Katz.   Toenail most affected are all ten nails. The hallux nails have completely cleared. The other nails have not really changed at all.  All other systems are negative.  Nails were filed thin. Laser therapy was administered to 1-5 toenails bilateral and patient tolerated the treatment well. All safety precautions were in place.   She is breastfeeding and no other treatment options were available at this time.  Follow up in 8 weeks for laser # 6.  The hallux nails are the only ones that cleared up completely. The other nails have not really changed. She will make her 8 week follow up for the last laser treatment, but she will also make an appointment with Dr. Allena Katz for sometime in January to discuss other options. She will be done breastfeeding in December.   ~Take pic next visit~

## 2020-09-18 DIAGNOSIS — M9902 Segmental and somatic dysfunction of thoracic region: Secondary | ICD-10-CM | POA: Diagnosis not present

## 2020-09-18 DIAGNOSIS — M955 Acquired deformity of pelvis: Secondary | ICD-10-CM | POA: Diagnosis not present

## 2020-09-18 DIAGNOSIS — M9905 Segmental and somatic dysfunction of pelvic region: Secondary | ICD-10-CM | POA: Diagnosis not present

## 2020-09-18 DIAGNOSIS — M5416 Radiculopathy, lumbar region: Secondary | ICD-10-CM | POA: Diagnosis not present

## 2020-09-18 DIAGNOSIS — M9903 Segmental and somatic dysfunction of lumbar region: Secondary | ICD-10-CM | POA: Diagnosis not present

## 2020-09-18 DIAGNOSIS — M62830 Muscle spasm of back: Secondary | ICD-10-CM | POA: Diagnosis not present

## 2020-10-21 DIAGNOSIS — M5416 Radiculopathy, lumbar region: Secondary | ICD-10-CM | POA: Diagnosis not present

## 2020-10-21 DIAGNOSIS — M9902 Segmental and somatic dysfunction of thoracic region: Secondary | ICD-10-CM | POA: Diagnosis not present

## 2020-10-21 DIAGNOSIS — M955 Acquired deformity of pelvis: Secondary | ICD-10-CM | POA: Diagnosis not present

## 2020-10-21 DIAGNOSIS — M9903 Segmental and somatic dysfunction of lumbar region: Secondary | ICD-10-CM | POA: Diagnosis not present

## 2020-10-21 DIAGNOSIS — M62830 Muscle spasm of back: Secondary | ICD-10-CM | POA: Diagnosis not present

## 2020-10-21 DIAGNOSIS — M9905 Segmental and somatic dysfunction of pelvic region: Secondary | ICD-10-CM | POA: Diagnosis not present

## 2020-11-01 ENCOUNTER — Ambulatory Visit (INDEPENDENT_AMBULATORY_CARE_PROVIDER_SITE_OTHER): Payer: No Typology Code available for payment source | Admitting: *Deleted

## 2020-11-01 ENCOUNTER — Other Ambulatory Visit: Payer: Self-pay

## 2020-11-01 DIAGNOSIS — B351 Tinea unguium: Secondary | ICD-10-CM

## 2020-11-01 NOTE — Progress Notes (Signed)
Patient presents today for the 6th laser treatment. Diagnosed with mycotic nail infection by Dr. Allena Katz.   Toenail most affected are all ten nails. The hallux nails have completely cleared. The other nails have not really changed at all.  All other systems are negative.  Nails were filed thin. Laser therapy was administered to 1-5 toenails bilateral and patient tolerated the treatment well. All safety precautions were in place.   She is breastfeeding and no other treatment options were available at this time.  Patient has completed the recommended laser treatments. He will follow up with Dr. Allena Katz in 1 months to evaluate progress and discuss other treatment options.    ~Final nail pic taken today~

## 2020-11-18 DIAGNOSIS — M5416 Radiculopathy, lumbar region: Secondary | ICD-10-CM | POA: Diagnosis not present

## 2020-11-18 DIAGNOSIS — M9903 Segmental and somatic dysfunction of lumbar region: Secondary | ICD-10-CM | POA: Diagnosis not present

## 2020-11-18 DIAGNOSIS — M955 Acquired deformity of pelvis: Secondary | ICD-10-CM | POA: Diagnosis not present

## 2020-11-18 DIAGNOSIS — M9902 Segmental and somatic dysfunction of thoracic region: Secondary | ICD-10-CM | POA: Diagnosis not present

## 2020-11-18 DIAGNOSIS — M9905 Segmental and somatic dysfunction of pelvic region: Secondary | ICD-10-CM | POA: Diagnosis not present

## 2020-11-18 DIAGNOSIS — M62830 Muscle spasm of back: Secondary | ICD-10-CM | POA: Diagnosis not present

## 2020-12-02 ENCOUNTER — Encounter: Payer: Self-pay | Admitting: Family Medicine

## 2020-12-02 ENCOUNTER — Other Ambulatory Visit: Payer: Self-pay

## 2020-12-02 ENCOUNTER — Ambulatory Visit (INDEPENDENT_AMBULATORY_CARE_PROVIDER_SITE_OTHER): Payer: No Typology Code available for payment source | Admitting: Family Medicine

## 2020-12-02 VITALS — BP 120/80 | HR 80 | Temp 98.2°F | Ht 64.0 in | Wt 203.0 lb

## 2020-12-02 DIAGNOSIS — R059 Cough, unspecified: Secondary | ICD-10-CM | POA: Diagnosis not present

## 2020-12-02 DIAGNOSIS — J019 Acute sinusitis, unspecified: Secondary | ICD-10-CM | POA: Diagnosis not present

## 2020-12-02 MED ORDER — AMOXICILLIN 500 MG PO CAPS: 500.0000 mg | ORAL_CAPSULE | Freq: Three times a day (TID) | ORAL | 0 refills | Status: DC

## 2020-12-02 MED ORDER — GUAIFENESIN-DM 100-10 MG/5ML PO SYRP: 5.0000 mL | ORAL_SOLUTION | ORAL | 0 refills | Status: DC | PRN

## 2020-12-02 NOTE — Progress Notes (Signed)
Date:  12/02/2020   Name:  Ellen Castillo   DOB:  1996/10/02   MRN:  [id number]   Chief Complaint: Cough (Non productive x 1 1/2 weeks)  Cough This is a new problem. The current episode started 1 to 4 weeks ago. The problem has been waxing and waning. The cough is productive of purulent sputum. Associated symptoms include nasal congestion, postnasal drip and rhinorrhea. Pertinent negatives include no chills, ear congestion, ear pain, eye redness, fever, headaches, myalgias, rash, sore throat, shortness of breath or wheezing. The symptoms are aggravated by lying down. She has tried OTC cough suppressant (mucinex/dayquil/nyquil) for the symptoms. The treatment provided mild relief. There is no history of environmental allergies.    No results found for: CREATININE, BUN, NA, K, CL, CO2 No results found for: CHOL, HDL, LDLCALC, LDLDIRECT, TRIG, CHOLHDL No results found for: TSH No results found for: HGBA1C Lab Results  Component Value Date   WBC 20.2 (H) 11/21/2019   HGB 12.5 11/21/2019   HCT 35.8 (L) 11/21/2019   MCV 91.8 11/21/2019   PLT 186 11/21/2019   Lab Results  Component Value Date   ALT 22 04/06/2016   AST 18 04/06/2016   ALKPHOS 70 04/06/2016   BILITOT 1.1 04/06/2016     Review of Systems  Constitutional: Negative.  Negative for chills, fatigue, fever and unexpected weight change.  HENT: Positive for postnasal drip and rhinorrhea. Negative for congestion, ear discharge, ear pain, sinus pressure, sneezing and sore throat.   Eyes: Negative for double vision, photophobia, pain, discharge, redness and itching.  Respiratory: Positive for cough. Negative for shortness of breath, wheezing and stridor.   Gastrointestinal: Negative for abdominal pain, blood in stool, constipation, diarrhea, nausea and vomiting.  Endocrine: Negative for cold intolerance, heat intolerance, polydipsia, polyphagia and polyuria.  Genitourinary: Negative for dysuria, flank pain, frequency,  hematuria, menstrual problem, pelvic pain, urgency, vaginal bleeding and vaginal discharge.  Musculoskeletal: Negative for arthralgias, back pain and myalgias.  Skin: Negative for rash.  Allergic/Immunologic: Negative for environmental allergies and food allergies.  Neurological: Negative for dizziness, weakness, light-headedness, numbness and headaches.  Hematological: Negative for adenopathy. Does not bruise/bleed easily.  Psychiatric/Behavioral: Negative for dysphoric mood. The patient is not nervous/anxious.     Patient Active Problem List   Diagnosis Date Noted  . Labor and delivery indication for care or intervention 11/20/2019  . Supervision of normal first pregnancy in third trimester 05/24/2019    No Known Allergies  Past Surgical History:  Procedure Laterality Date  . MOUTH SURGERY    . NO PAST SURGERIES      Social History   Tobacco Use  . Smoking status: Never Smoker  . Smokeless tobacco: Never Used  Vaping Use  . Vaping Use: Never used  Substance Use Topics  . Alcohol use: No  . Drug use: No     Medication list has been reviewed and updated.  Current Meds  Medication Sig  . acetaminophen (TYLENOL) 500 MG tablet Take 2 tablets (1,000 mg total) by mouth every 6 (six) hours as needed (for pain scale < 4).  . ibuprofen (ADVIL) 600 MG tablet Take 1 tablet (600 mg total) by mouth every 6 (six) hours.  . Prenatal Vit-Fe Fumarate-FA (PRENATAL MULTIVITAMIN) TABS tablet Take 1 tablet by mouth daily at 12 noon.  . sertraline (ZOLOFT) 50 MG tablet Take 50 mg by mouth daily.  . [DISCONTINUED] omeprazole (PRILOSEC) 20 MG capsule Take by mouth.  . [DISCONTINUED] Prenat-FeFum-DSS-FA-DHA w/o A (  PNV-DHA+DOCUSATE) 27-1.25-300 MG CAPS Take by mouth.  . [DISCONTINUED] pyridOXINE (VITAMIN B-6) 25 MG tablet Take by mouth.    PHQ 2/9 Scores 12/02/2020  PHQ - 2 Score 0    No flowsheet data found.  BP Readings from Last 3 Encounters:  12/02/20 120/80  11/22/19 122/88   04/06/16 100/80 (15 %, Z = -1.04 /  95 %, Z = 1.64)*   *BP percentiles are based on the 2017 AAP Clinical Practice Guideline for girls    Physical Exam Vitals and nursing note reviewed.  Constitutional:      Appearance: She is well-developed and well-nourished.  HENT:     Head: Normocephalic.     Right Ear: Hearing, tympanic membrane, ear canal and external ear normal.     Left Ear: Hearing, tympanic membrane, ear canal and external ear normal.     Nose: Nose normal.     Right Turbinates: Swollen.     Left Turbinates: Swollen.     Right Sinus: No maxillary sinus tenderness or frontal sinus tenderness.     Left Sinus: No maxillary sinus tenderness or frontal sinus tenderness.     Mouth/Throat:     Mouth: Oropharynx is clear and moist. Mucous membranes are moist.     Pharynx: Oropharynx is clear. Uvula midline.  Eyes:     General: Lids are everted, no foreign bodies appreciated. No scleral icterus.       Left eye: No foreign body or hordeolum.     Extraocular Movements: EOM normal.     Conjunctiva/sclera: Conjunctivae normal.     Right eye: Right conjunctiva is not injected.     Left eye: Left conjunctiva is not injected.     Pupils: Pupils are equal, round, and reactive to light.  Neck:     Thyroid: No thyromegaly.     Vascular: No JVD.     Trachea: No tracheal deviation.  Cardiovascular:     Rate and Rhythm: Normal rate and regular rhythm.     Pulses: Intact distal pulses.     Heart sounds: Normal heart sounds. No murmur heard. No friction rub. No gallop.   Pulmonary:     Effort: Pulmonary effort is normal. No respiratory distress.     Breath sounds: Normal breath sounds. No wheezing, rhonchi or rales.  Abdominal:     General: Bowel sounds are normal.     Palpations: Abdomen is soft. There is no hepatosplenomegaly or mass.     Tenderness: There is no abdominal tenderness. There is no guarding or rebound.  Musculoskeletal:        General: No tenderness or edema. Normal  range of motion.     Cervical back: Normal range of motion and neck supple.  Lymphadenopathy:     Cervical: No cervical adenopathy.  Skin:    General: Skin is warm.     Findings: No rash.  Neurological:     Mental Status: She is alert and oriented to person, place, and time.     Cranial Nerves: No cranial nerve deficit.     Deep Tendon Reflexes: Strength normal. Reflexes normal.  Psychiatric:        Mood and Affect: Mood and affect normal. Mood is not anxious or depressed.     Wt Readings from Last 3 Encounters:  12/02/20 203 lb (92.1 kg)  11/20/19 207 lb (93.9 kg)  04/06/16 216 lb (98 kg) (99 %, Z= 2.17)*   * Growth percentiles are based on CDC (Girls, 2-20 Years) data.  BP 120/80   Pulse 80   Temp 98.2 F (36.8 C) (Oral)   Ht 5\' 4"  (1.626 m)   Wt 203 lb (92.1 kg)   LMP 12/02/2020   SpO2 99%   Breastfeeding Yes   BMI 34.84 kg/m   Assessment and Plan:  1. Acute sinusitis, recurrence not specified, unspecified location Acute.  Persistent.  Stable.  Will initiate amoxicillin 500 mg 3 times a day for 10 days given that this is gone on more than a week and has some coloration to his nasal discharge. - amoxicillin (AMOXIL) 500 MG capsule; Take 1 capsule (500 mg total) by mouth 3 (three) times daily.  Dispense: 30 capsule; Refill: 0  2. Cough Persistent.  Stable.  Will use expectorant with dextromethorphan for cough suppression since patient is lactating. - guaiFENesin-dextromethorphan (ROBITUSSIN DM) 100-10 MG/5ML syrup; Take 5 mLs by mouth every 4 (four) hours as needed for cough.  Dispense: 118 mL; Refill: 0

## 2020-12-06 ENCOUNTER — Other Ambulatory Visit: Payer: No Typology Code available for payment source

## 2020-12-06 DIAGNOSIS — Z1152 Encounter for screening for COVID-19: Secondary | ICD-10-CM | POA: Diagnosis not present

## 2020-12-06 DIAGNOSIS — Z03818 Encounter for observation for suspected exposure to other biological agents ruled out: Secondary | ICD-10-CM | POA: Diagnosis not present

## 2020-12-06 DIAGNOSIS — Z20822 Contact with and (suspected) exposure to covid-19: Secondary | ICD-10-CM | POA: Diagnosis not present

## 2020-12-10 DIAGNOSIS — Z1152 Encounter for screening for COVID-19: Secondary | ICD-10-CM | POA: Diagnosis not present

## 2020-12-10 DIAGNOSIS — Z03818 Encounter for observation for suspected exposure to other biological agents ruled out: Secondary | ICD-10-CM | POA: Diagnosis not present

## 2020-12-18 DIAGNOSIS — M955 Acquired deformity of pelvis: Secondary | ICD-10-CM | POA: Diagnosis not present

## 2020-12-18 DIAGNOSIS — M9905 Segmental and somatic dysfunction of pelvic region: Secondary | ICD-10-CM | POA: Diagnosis not present

## 2020-12-18 DIAGNOSIS — M62830 Muscle spasm of back: Secondary | ICD-10-CM | POA: Diagnosis not present

## 2020-12-18 DIAGNOSIS — M5416 Radiculopathy, lumbar region: Secondary | ICD-10-CM | POA: Diagnosis not present

## 2020-12-18 DIAGNOSIS — M9903 Segmental and somatic dysfunction of lumbar region: Secondary | ICD-10-CM | POA: Diagnosis not present

## 2020-12-18 DIAGNOSIS — M9902 Segmental and somatic dysfunction of thoracic region: Secondary | ICD-10-CM | POA: Diagnosis not present

## 2021-01-15 DIAGNOSIS — M9905 Segmental and somatic dysfunction of pelvic region: Secondary | ICD-10-CM | POA: Diagnosis not present

## 2021-01-15 DIAGNOSIS — M62830 Muscle spasm of back: Secondary | ICD-10-CM | POA: Diagnosis not present

## 2021-01-15 DIAGNOSIS — M5416 Radiculopathy, lumbar region: Secondary | ICD-10-CM | POA: Diagnosis not present

## 2021-01-15 DIAGNOSIS — M955 Acquired deformity of pelvis: Secondary | ICD-10-CM | POA: Diagnosis not present

## 2021-01-15 DIAGNOSIS — M9903 Segmental and somatic dysfunction of lumbar region: Secondary | ICD-10-CM | POA: Diagnosis not present

## 2021-01-15 DIAGNOSIS — M9902 Segmental and somatic dysfunction of thoracic region: Secondary | ICD-10-CM | POA: Diagnosis not present

## 2021-02-06 ENCOUNTER — Ambulatory Visit (INDEPENDENT_AMBULATORY_CARE_PROVIDER_SITE_OTHER): Payer: No Typology Code available for payment source | Admitting: Podiatry

## 2021-02-06 ENCOUNTER — Other Ambulatory Visit: Payer: Self-pay

## 2021-02-06 ENCOUNTER — Encounter: Payer: Self-pay | Admitting: Podiatry

## 2021-02-06 DIAGNOSIS — Z79899 Other long term (current) drug therapy: Secondary | ICD-10-CM

## 2021-02-06 DIAGNOSIS — B351 Tinea unguium: Secondary | ICD-10-CM | POA: Diagnosis not present

## 2021-02-06 NOTE — Progress Notes (Signed)
Subjective:  Patient ID: Ellen Castillo, female    DOB: [birth year],  MRN: [id number]  Chief Complaint  Patient presents with  . Nail Problem    Nail check.  "the laser treatment helped a lot.  Now I would like to try the oral medication"    25 y.o. female presents with the above complaint.  Patient presents with a follow-up of thickened elongated dystrophic toenails x10.  Patient states both hallux has completely resolved.  She still has some fungus and rest of the nails.  She states to have strengthened a lot better.  She is not interested in doing Lamisil.  She denies any other acute complaints.   Review of Systems: Negative except as noted in the HPI. Denies N/V/F/Ch.  Past Medical History:  Diagnosis Date  . Allergy   . GERD (gastroesophageal reflux disease)     Current Outpatient Medications:  .  acetaminophen (TYLENOL) 500 MG tablet, Take 2 tablets (1,000 mg total) by mouth every 6 (six) hours as needed (for pain scale < 4)., Disp: 30 tablet, Rfl: 0 .  ibuprofen (ADVIL) 600 MG tablet, Take 1 tablet (600 mg total) by mouth every 6 (six) hours., Disp: 30 tablet, Rfl: 0 .  sertraline (ZOLOFT) 50 MG tablet, Take 50 mg by mouth daily., Disp: , Rfl:   Social History   Tobacco Use  Smoking Status Never Smoker  Smokeless Tobacco Never Used    No Known Allergies Objective:  There were no vitals filed for this visit. There is no height or weight on file to calculate BMI. Constitutional Well developed. Well nourished.  Vascular Dorsalis pedis pulses palpable bilaterally. Posterior tibial pulses palpable bilaterally. Capillary refill normal to all digits.  No cyanosis or clubbing noted. Pedal hair growth normal.  Neurologic Normal speech. Oriented to person, place, and time. Epicritic sensation to light touch grossly present bilaterally.  Dermatologic  thickened elongated dystrophic mycotic mildly painful toenails x8.  Improving nail fungus to all the nails M completely  resolved of bilateral hallux Skin within normal limits.  No skin lesions.  Orthopedic: Normal joint ROM without pain or crepitus bilaterally. No visible deformities. No bony tenderness.   Radiographs: None Assessment:   1. Encounter for long-term current use of medication   2. Onychomycosis due to dermatophyte   3. Nail fungus    Plan:  Patient was evaluated and treated and all questions answered.  Bilateral onychomycosis of all the digits x8 -Educated the patient on the etiology of onychomycosis and various treatment options associated with improving the fungal load.  I explained to the patient that there is 3 treatment options available to treat the onychomycosis including topical, p.o., laser treatment.  Patient elected to undergo p.o. options with Lamisil/terbinafine therapy.  In order for me to start the medication therapy, I explained to the patient the importance of evaluating the liver and obtaining the liver function test.  Once the liver function test comes back normal I will start him on 37-month course of Lamisil therapy.  Patient understood all risk and would like to proceed with Lamisil therapy.  I have asked the patient to immediately stop the Lamisil therapy if she has any reactions to it and call the office or go to the emergency room right away.  Patient states understanding -The legs has seems to improve both of her hallux nails.  However she has still has some residual fungus infection left.  We will plan on treating that with Lamisil as described above.  No follow-ups on file.

## 2021-02-07 LAB — HEPATIC FUNCTION PANEL
ALT: 14 IU/L (ref 0–32)
AST: 12 IU/L (ref 0–40)
Albumin: 4.7 g/dL (ref 3.9–5.0)
Alkaline Phosphatase: 66 IU/L (ref 44–121)
Bilirubin Total: 1.2 mg/dL (ref 0.0–1.2)
Bilirubin, Direct: 0.27 mg/dL (ref 0.00–0.40)
Total Protein: 7.1 g/dL (ref 6.0–8.5)

## 2021-02-10 ENCOUNTER — Other Ambulatory Visit: Payer: Self-pay | Admitting: Podiatry

## 2021-02-10 MED ORDER — TERBINAFINE HCL 250 MG PO TABS: 250.0000 mg | ORAL_TABLET | Freq: Every day | ORAL | 0 refills | Status: DC

## 2021-02-12 DIAGNOSIS — M9903 Segmental and somatic dysfunction of lumbar region: Secondary | ICD-10-CM | POA: Diagnosis not present

## 2021-02-12 DIAGNOSIS — M62830 Muscle spasm of back: Secondary | ICD-10-CM | POA: Diagnosis not present

## 2021-02-12 DIAGNOSIS — M9905 Segmental and somatic dysfunction of pelvic region: Secondary | ICD-10-CM | POA: Diagnosis not present

## 2021-02-12 DIAGNOSIS — M9902 Segmental and somatic dysfunction of thoracic region: Secondary | ICD-10-CM | POA: Diagnosis not present

## 2021-02-12 DIAGNOSIS — M5416 Radiculopathy, lumbar region: Secondary | ICD-10-CM | POA: Diagnosis not present

## 2021-02-12 DIAGNOSIS — M955 Acquired deformity of pelvis: Secondary | ICD-10-CM | POA: Diagnosis not present

## 2021-03-06 DIAGNOSIS — E669 Obesity, unspecified: Secondary | ICD-10-CM | POA: Diagnosis not present

## 2021-04-08 DIAGNOSIS — E669 Obesity, unspecified: Secondary | ICD-10-CM | POA: Diagnosis not present

## 2021-04-30 ENCOUNTER — Telehealth: Payer: Self-pay | Admitting: Podiatry

## 2021-04-30 NOTE — Telephone Encounter (Signed)
Patient has requested refill on Lamisil, Please Advise 

## 2021-04-30 NOTE — Telephone Encounter (Signed)
She would have to come see me for reevaluation and to obtain new liver studies.

## 2021-04-30 NOTE — Telephone Encounter (Signed)
LVM for patient to return call to get scheduled and regarding liver panel as well

## 2021-06-12 ENCOUNTER — Ambulatory Visit (INDEPENDENT_AMBULATORY_CARE_PROVIDER_SITE_OTHER): Payer: No Typology Code available for payment source | Admitting: Podiatry

## 2021-06-12 ENCOUNTER — Other Ambulatory Visit: Payer: Self-pay

## 2021-06-12 ENCOUNTER — Encounter: Payer: Self-pay | Admitting: Podiatry

## 2021-06-12 DIAGNOSIS — Z79899 Other long term (current) drug therapy: Secondary | ICD-10-CM | POA: Diagnosis not present

## 2021-06-12 DIAGNOSIS — B351 Tinea unguium: Secondary | ICD-10-CM | POA: Diagnosis not present

## 2021-06-12 NOTE — Progress Notes (Signed)
  Subjective:  Patient ID: Ellen Castillo, female    DOB: Jan 07, 1996,  MRN: [id number]  Chief Complaint  Patient presents with   Nail Problem    Nail fungus follow up PT stated that she feels like the medication has helped     25 y.o. female presents with the above complaint.  Patient presents with a follow-up of thickened elongated dystrophic toenails x10.  She would like to do another round of Lamisil therapy.  She denies any other acute complaints.  The Lamisil is helping.   Review of Systems: Negative except as noted in the HPI. Denies N/V/F/Ch.  Past Medical History:  Diagnosis Date   Allergy    GERD (gastroesophageal reflux disease)     Current Outpatient Medications:    acetaminophen (TYLENOL) 500 MG tablet, Take 2 tablets (1,000 mg total) by mouth every 6 (six) hours as needed (for pain scale < 4)., Disp: 30 tablet, Rfl: 0   ibuprofen (ADVIL) 600 MG tablet, Take 1 tablet (600 mg total) by mouth every 6 (six) hours., Disp: 30 tablet, Rfl: 0   sertraline (ZOLOFT) 50 MG tablet, Take 50 mg by mouth daily., Disp: , Rfl:    terbinafine (LAMISIL) 250 MG tablet, Take 1 tablet (250 mg total) by mouth daily., Disp: 90 tablet, Rfl: 0  Social History   Tobacco Use  Smoking Status Never  Smokeless Tobacco Never    No Known Allergies Objective:  There were no vitals filed for this visit. There is no height or weight on file to calculate BMI. Constitutional Well developed. Well nourished.  Vascular Dorsalis pedis pulses palpable bilaterally. Posterior tibial pulses palpable bilaterally. Capillary refill normal to all digits.  No cyanosis or clubbing noted. Pedal hair growth normal.  Neurologic Normal speech. Oriented to person, place, and time. Epicritic sensation to light touch grossly present bilaterally.  Dermatologic  thickened elongated dystrophic mycotic mildly painful toenails x8.  Improving nail fungus to all the nails M completely resolved of bilateral hallux Skin  within normal limits.  No skin lesions.  Orthopedic: Normal joint ROM without pain or crepitus bilaterally. No visible deformities. No bony tenderness.   Radiographs: None Assessment:   1. Encounter for long-term current use of medication   2. Onychomycosis due to dermatophyte   3. Nail fungus    Plan:  Patient was evaluated and treated and all questions answered.  Bilateral onychomycosis of all the digits x8~second round -Educated the patient on the etiology of onychomycosis and various treatment options associated with improving the fungal load.  I explained to the patient that there is 3 treatment options available to treat the onychomycosis including topical, p.o., laser treatment.  Patient elected to undergo p.o. options with Lamisil/terbinafine therapy.  In order for me to start the medication therapy, I explained to the patient the importance of evaluating the liver and obtaining the liver function test.  Once the liver function test comes back normal I will start him on 22-month course of Lamisil therapy.  Patient understood all risk and would like to proceed with Lamisil therapy.  I have asked the patient to immediately stop the Lamisil therapy if she has any reactions to it and call the office or go to the emergency room right away.  Patient states understanding -I discussed with the patient that this will be the final round.  Clinically the nails are improving    No follow-ups on file.

## 2021-06-13 LAB — HEPATIC FUNCTION PANEL
ALT: 10 IU/L (ref 0–32)
AST: 13 IU/L (ref 0–40)
Albumin: 4.6 g/dL (ref 3.9–5.0)
Alkaline Phosphatase: 59 IU/L (ref 44–121)
Bilirubin Total: 1.2 mg/dL (ref 0.0–1.2)
Bilirubin, Direct: 0.22 mg/dL (ref 0.00–0.40)
Total Protein: 7.1 g/dL (ref 6.0–8.5)

## 2021-06-13 MED ORDER — TERBINAFINE HCL 250 MG PO TABS: 250.0000 mg | ORAL_TABLET | Freq: Every day | ORAL | 0 refills | Status: DC

## 2021-06-13 NOTE — Addendum Note (Signed)
Addended by: Nicholes Rough on: 06/13/2021 09:21 AM   Modules accepted: Orders

## 2021-08-06 LAB — RESULTS CONSOLE HPV: CHL HPV: NEGATIVE

## 2021-08-06 LAB — HM PAP SMEAR: HM Pap smear: NEGATIVE

## 2021-08-21 DIAGNOSIS — Z01419 Encounter for gynecological examination (general) (routine) without abnormal findings: Secondary | ICD-10-CM | POA: Diagnosis not present

## 2021-08-21 DIAGNOSIS — Z0000 Encounter for general adult medical examination without abnormal findings: Secondary | ICD-10-CM | POA: Diagnosis not present

## 2021-08-21 DIAGNOSIS — Z113 Encounter for screening for infections with a predominantly sexual mode of transmission: Secondary | ICD-10-CM | POA: Diagnosis not present

## 2021-09-29 ENCOUNTER — Telehealth: Payer: Self-pay | Admitting: Podiatry

## 2021-09-29 NOTE — Telephone Encounter (Signed)
Patient called stating she needs a RX refill for her toe nail fungus. Pt uses [facility] in [place].

## 2021-09-30 NOTE — Telephone Encounter (Signed)
Called pt LM on VM for her to call the office so I relay message to her.

## 2021-10-16 ENCOUNTER — Other Ambulatory Visit: Payer: Self-pay

## 2021-10-16 ENCOUNTER — Encounter (INDEPENDENT_AMBULATORY_CARE_PROVIDER_SITE_OTHER): Payer: Self-pay

## 2021-10-16 ENCOUNTER — Ambulatory Visit (INDEPENDENT_AMBULATORY_CARE_PROVIDER_SITE_OTHER): Payer: No Typology Code available for payment source | Admitting: Podiatry

## 2021-10-16 DIAGNOSIS — Z79899 Other long term (current) drug therapy: Secondary | ICD-10-CM | POA: Diagnosis not present

## 2021-10-16 DIAGNOSIS — B351 Tinea unguium: Secondary | ICD-10-CM | POA: Diagnosis not present

## 2021-10-16 NOTE — Progress Notes (Signed)
Subjective:  Patient ID: Ellen Castillo, female    DOB: May 08, 1996,  MRN: [id number]  Chief Complaint  Patient presents with   Nail Problem    Nail fungus follow up     25 y.o. female presents with the above complaint.  Patient presents with a follow-up of thickened elongated dystrophic toenails x10.  She states the Lamisil is helping.  She would like to do another round if possible.  She understands that only tender mostly due to rounds but she would like to know if she can continue a little bit more.  Review of Systems: Negative except as noted in the HPI. Denies N/V/F/Ch.  Past Medical History:  Diagnosis Date   Allergy    GERD (gastroesophageal reflux disease)     Current Outpatient Medications:    acetaminophen (TYLENOL) 500 MG tablet, Take 2 tablets (1,000 mg total) by mouth every 6 (six) hours as needed (for pain scale < 4)., Disp: 30 tablet, Rfl: 0   ibuprofen (ADVIL) 600 MG tablet, Take 1 tablet (600 mg total) by mouth every 6 (six) hours., Disp: 30 tablet, Rfl: 0   sertraline (ZOLOFT) 50 MG tablet, Take 50 mg by mouth daily., Disp: , Rfl:    terbinafine (LAMISIL) 250 MG tablet, Take 1 tablet (250 mg total) by mouth daily., Disp: 90 tablet, Rfl: 0   terbinafine (LAMISIL) 250 MG tablet, Take 1 tablet (250 mg total) by mouth daily., Disp: 90 tablet, Rfl: 0  Social History   Tobacco Use  Smoking Status Never  Smokeless Tobacco Never    No Known Allergies Objective:  There were no vitals filed for this visit. There is no height or weight on file to calculate BMI. Constitutional Well developed. Well nourished.  Vascular Dorsalis pedis pulses palpable bilaterally. Posterior tibial pulses palpable bilaterally. Capillary refill normal to all digits.  No cyanosis or clubbing noted. Pedal hair growth normal.  Neurologic Normal speech. Oriented to person, place, and time. Epicritic sensation to light touch grossly present bilaterally.  Dermatologic  thickened  elongated dystrophic mycotic mildly painful toenails x8.  Improving nail fungus to all the nails M completely resolved of bilateral hallux Skin within normal limits.  No skin lesions.  Orthopedic: Normal joint ROM without pain or crepitus bilaterally. No visible deformities. No bony tenderness.   Radiographs: None Assessment:   1. Encounter for long-term current use of medication   2. Onychomycosis due to dermatophyte    Plan:  Patient was evaluated and treated and all questions answered.  Bilateral onychomycosis of all the digits x8~third round -Educated the patient on the etiology of onychomycosis and various treatment options associated with improving the fungal load.  I explained to the patient that there is 3 treatment options available to treat the onychomycosis including topical, p.o., laser treatment.  Patient elected to undergo p.o. options with Lamisil/terbinafine therapy.  In order for me to start the medication therapy, I explained to the patient the importance of evaluating the liver and obtaining the liver function test.  Once the liver function test comes back normal I will start him on 12-month course of Lamisil therapy.  Patient understood all risk and would like to proceed with Lamisil therapy.  I have asked the patient to immediately stop the Lamisil therapy if she has any reactions to it and call the office or go to the emergency room right away.  Patient states understanding -The nails are improved considerably.  There is still residual fungal infection left.  If the liver  function tests are normal we will plan on doing a third round and essentially this will be the last round    No follow-ups on file.

## 2021-10-17 LAB — HEPATIC FUNCTION PANEL
ALT: 11 IU/L (ref 0–32)
AST: 16 IU/L (ref 0–40)
Albumin: 4.6 g/dL (ref 3.9–5.0)
Alkaline Phosphatase: 59 IU/L (ref 44–121)
Bilirubin Total: 1.2 mg/dL (ref 0.0–1.2)
Bilirubin, Direct: 0.26 mg/dL (ref 0.00–0.40)
Total Protein: 6.7 g/dL (ref 6.0–8.5)

## 2021-10-17 MED ORDER — TERBINAFINE HCL 250 MG PO TABS: 250.0000 mg | ORAL_TABLET | Freq: Every day | ORAL | 0 refills | Status: DC

## 2021-10-17 NOTE — Addendum Note (Signed)
Addended by: Jerell Demery on: 10/17/2021 07:40 AM   Modules accepted: Orders  

## 2021-12-08 ENCOUNTER — Encounter: Payer: Self-pay | Admitting: Adult Health

## 2021-12-08 ENCOUNTER — Ambulatory Visit: Payer: No Typology Code available for payment source | Admitting: Adult Health

## 2021-12-08 VITALS — BP 132/81 | HR 100 | Ht 64.0 in | Wt 182.0 lb

## 2021-12-08 DIAGNOSIS — F411 Generalized anxiety disorder: Secondary | ICD-10-CM | POA: Diagnosis not present

## 2021-12-08 MED ORDER — SERTRALINE HCL 50 MG PO TABS: 50.0000 mg | ORAL_TABLET | Freq: Every day | ORAL | 2 refills | Status: DC

## 2021-12-08 NOTE — Progress Notes (Addendum)
[facility] MD/PA/NP Initial Note  12/08/2021 2:22 PM Ellen Castillo  MRN:  [id number]  Chief Complaint:   HPI:   Describes mood today as "ok". Pleasant. Denies tearfulness. Mood symptoms - reports some depression, anxiety, and irritability. More anxious overall - "more so when things don't go right for me". Reports some anxiety attacks. Anxiety comes from not doing things right - "not doing my duties". Reports some worry and rumination over toddler. Has difficulties trusting others with son outside of her family. Describes herself as a "people pleaser" - "holds things in". Feels like she is "too hard" on herself at times. Has taken Zoloft in the past - postpartum depression. Felt like the Zoloft helped "eased" her mind and would like to try again. Stable interest and motivation. Taking medications as prescribed.  Energy levels stable. Active, has a regular exercise routine. Enjoys some usual interests and activities. In a relationship - 4 years. Has a 57 year old son. Spending time with family. Appetite adequate. Weight loss - 179 pounds. Sleeps better some nights than others. Averages 6 to 7 hours. Focus and concentration stable. Completing tasks. Managing aspects of household. Stay at home mom. Denies SI or HI.  Denies AH or VH.  Previous medication trials:  Zoloft  Visit Diagnosis:    ICD-10-CM   1. Generalized anxiety disorder  F41.1 sertraline (ZOLOFT) 50 MG tablet      Past Psychiatric History: Denies psychiatric hospitalization.   Past Medical History:  Past Medical History:  Diagnosis Date   Allergy    GERD (gastroesophageal reflux disease)     Past Surgical History:  Procedure Laterality Date   MOUTH SURGERY     NO PAST SURGERIES      Family Psychiatric History:  Family history of mental illness depression and anxiety.  Family History:  Family History  Problem Relation Age of Onset   Hyperlipidemia Father     Social History:  Social History   Socioeconomic  History   Marital status: Significant Other    Spouse name: Advertising account planner   Number of children: Not on file   Years of education: Not on file   Highest education level: Not on file  Occupational History   Not on file  Tobacco Use   Smoking status: Never   Smokeless tobacco: Never  Vaping Use   Vaping Use: Never used  Substance and Sexual Activity   Alcohol use: No   Drug use: No   Sexual activity: Never    Birth control/protection: Pill  Other Topics Concern   Not on file  Social History Narrative   Not on file   Social Determinants of Health   Financial Resource Strain: Not on file  Food Insecurity: Not on file  Transportation Needs: Not on file  Physical Activity: Not on file  Stress: Not on file  Social Connections: Not on file    Allergies: No Known Allergies  Metabolic Disorder Labs: No results found for: HGBA1C, MPG No results found for: PROLACTIN No results found for: CHOL, TRIG, HDL, CHOLHDL, VLDL, LDLCALC No results found for: TSH  Therapeutic Level Labs: No results found for: LITHIUM No results found for: VALPROATE No components found for:  CBMZ  Current Medications: Current Outpatient Medications  Medication Sig Dispense Refill   phentermine (ADIPEX-P) 37.5 MG tablet Take 37.5 mg by mouth daily.     sertraline (ZOLOFT) 50 MG tablet Take 1 tablet (50 mg total) by mouth daily. 30 tablet 2   No current facility-administered medications for  this visit.    Medication Side Effects: none  Orders placed this visit:  No orders of the defined types were placed in this encounter.   Psychiatric Specialty Exam:  Review of Systems  Musculoskeletal:  Negative for gait problem.  Neurological:  Negative for tremors.  Psychiatric/Behavioral:         Please refer to HPI     General Appearance: Casual and Neat  Eye Contact:  Good  Speech:  Clear and Coherent and Normal Rate  Volume:  Normal  Mood:  Anxious  Affect:  Appropriate and Congruent  Thought Process:   Coherent and Descriptions of Associations: Intact  Orientation:  Full (Time, Place, and Person)  Thought Content: Logical   Suicidal Thoughts:  No  Homicidal Thoughts:  No  Memory:  WNL  Judgement:  Good  Insight:  Good  Psychomotor Activity:  Normal  Concentration:  Concentration: Good  Recall:  Good  Fund of Knowledge: Good  Language: Good  Assets:  Communication Skills Desire for Improvement Financial Resources/Insurance Housing Intimacy Leisure Time Physical Health Resilience Social Support Talents/Skills Transportation Vocational/Educational  ADL's:  Intact  Cognition: WNL  Prognosis:  Good   Screenings:  PHQ2-9    Flowsheet Row Office Visit from 12/02/2020 in [facility]  PHQ-2 Total Score 0       Receiving Psychotherapy: No   Treatment Plan/Recommendations:   Plan:  PDMP reviewed  Add Zoloft 50mg  daily  Time spent with patient was 60 minutes. Greater than 50% of face to face time with patient was spent on counseling and coordination of care.    RTC 4 weeks  Patient advised to contact office with any questions, adverse effects, or acute worsening in signs and symptoms.      , NP

## 2021-12-18 ENCOUNTER — Telehealth: Payer: Self-pay

## 2021-12-18 NOTE — Telephone Encounter (Signed)
Called pt scheduled an appt. For tomorrow 12/19/21.  KP

## 2021-12-18 NOTE — Telephone Encounter (Signed)
Copied from [place] [id number]. Topic: Referral - Request for Referral >> Dec 18, 2021 12:31 PM Fields, Museum/gallery conservator R wrote: Has patient seen PCP for this complaint? No. *If NO, is insurance requiring patient see PCP for this issue before PCP can refer them? Referral for which specialty: dermatology  Preferred provider/office: [facility] in [place] phone: [phone] Reason for referral: skin problem

## 2021-12-19 ENCOUNTER — Ambulatory Visit: Payer: No Typology Code available for payment source | Admitting: Family Medicine

## 2021-12-19 ENCOUNTER — Telehealth: Payer: Self-pay

## 2021-12-19 NOTE — Telephone Encounter (Signed)
Pt called asking about a referral to dermatology. She was told by dermatology she would need a referral, in which case she would need to see her pcp first. She has her OBGYN listed as her pcp- she has been advised to call them and see if they will do it for her. If they will not and they say she has to get it from a primary care physician, she will need to get dr Yetta Barre' name put on the card for [place] to refer out. Pt voiced understanding

## 2021-12-19 NOTE — Telephone Encounter (Signed)
Copied from CRM [id number]. Topic: General - Other >> Dec 19, 2021  2:34 PM Marylen Ponto wrote: Reason for CRM: Pt requests return call from [place]. Cb# [phone]

## 2022-01-05 DIAGNOSIS — M955 Acquired deformity of pelvis: Secondary | ICD-10-CM | POA: Diagnosis not present

## 2022-01-05 DIAGNOSIS — M9905 Segmental and somatic dysfunction of pelvic region: Secondary | ICD-10-CM | POA: Diagnosis not present

## 2022-01-05 DIAGNOSIS — M62830 Muscle spasm of back: Secondary | ICD-10-CM | POA: Diagnosis not present

## 2022-01-05 DIAGNOSIS — M9902 Segmental and somatic dysfunction of thoracic region: Secondary | ICD-10-CM | POA: Diagnosis not present

## 2022-01-05 DIAGNOSIS — M5416 Radiculopathy, lumbar region: Secondary | ICD-10-CM | POA: Diagnosis not present

## 2022-01-05 DIAGNOSIS — M9903 Segmental and somatic dysfunction of lumbar region: Secondary | ICD-10-CM | POA: Diagnosis not present

## 2022-01-26 ENCOUNTER — Ambulatory Visit: Payer: No Typology Code available for payment source | Admitting: Adult Health

## 2022-02-02 DIAGNOSIS — M955 Acquired deformity of pelvis: Secondary | ICD-10-CM | POA: Diagnosis not present

## 2022-02-02 DIAGNOSIS — M9902 Segmental and somatic dysfunction of thoracic region: Secondary | ICD-10-CM | POA: Diagnosis not present

## 2022-02-02 DIAGNOSIS — M9903 Segmental and somatic dysfunction of lumbar region: Secondary | ICD-10-CM | POA: Diagnosis not present

## 2022-02-02 DIAGNOSIS — M9905 Segmental and somatic dysfunction of pelvic region: Secondary | ICD-10-CM | POA: Diagnosis not present

## 2022-02-02 DIAGNOSIS — M5416 Radiculopathy, lumbar region: Secondary | ICD-10-CM | POA: Diagnosis not present

## 2022-02-02 DIAGNOSIS — M62830 Muscle spasm of back: Secondary | ICD-10-CM | POA: Diagnosis not present

## 2022-02-17 ENCOUNTER — Other Ambulatory Visit: Payer: Self-pay

## 2022-02-17 ENCOUNTER — Ambulatory Visit (INDEPENDENT_AMBULATORY_CARE_PROVIDER_SITE_OTHER): Payer: No Typology Code available for payment source | Admitting: Podiatry

## 2022-02-17 ENCOUNTER — Encounter: Payer: Self-pay | Admitting: Podiatry

## 2022-02-17 DIAGNOSIS — B351 Tinea unguium: Secondary | ICD-10-CM

## 2022-02-17 MED ORDER — CICLOPIROX 8 % EX SOLN: Freq: Every day | CUTANEOUS | 0 refills | Status: DC

## 2022-02-19 NOTE — Progress Notes (Signed)
?  Subjective:  ?Patient ID: Ellen Castillo, female    DOB: 1996-10-23,  MRN: [id number] ? ?Chief Complaint  ?Patient presents with  ? Nail Problem  ?  "They're doing a lot better."  ? ? ?26 y.o. female presents with the above complaint.  Patient presents with a follow-up of thickened elongated dystrophic toenails x10.  She states Lamisil therapy helped completely resolve all of her nails except the right second toe.  She would like to discuss topical options at this time. ? ?Review of Systems: Negative except as noted in the HPI. Denies N/V/F/Ch. ? ?Past Medical History:  ?Diagnosis Date  ? Allergy   ? GERD (gastroesophageal reflux disease)   ? ? ?Current Outpatient Medications:  ?  ciclopirox (PENLAC) 8 % solution, Apply topically at bedtime. Apply over nail and surrounding skin. Apply daily over previous coat. After seven (7) days, may remove with alcohol and continue cycle., Disp: 6.6 mL, Rfl: 0 ?  phentermine (ADIPEX-P) 37.5 MG tablet, Take 37.5 mg by mouth daily., Disp: , Rfl:  ?  sertraline (ZOLOFT) 50 MG tablet, Take 1 tablet (50 mg total) by mouth daily., Disp: 30 tablet, Rfl: 2 ? ?Social History  ? ?Tobacco Use  ?Smoking Status Never  ?Smokeless Tobacco Never  ? ? ?No Known Allergies ?Objective:  ?There were no vitals filed for this visit. ?There is no height or weight on file to calculate BMI. ?Constitutional Well developed. ?Well nourished.  ?Vascular Dorsalis pedis pulses palpable bilaterally. ?Posterior tibial pulses palpable bilaterally. ?Capillary refill normal to all digits.  ?No cyanosis or clubbing noted. ?Pedal hair growth normal.  ?Neurologic Normal speech. ?Oriented to person, place, and time. ?Epicritic sensation to light touch grossly present bilaterally.  ?Dermatologic All nails have resolved completely.  Mild residual discoloration/fungal infection present right second digit. ?Skin within normal limits.  No skin lesions.  ?Orthopedic: Normal joint ROM without pain or crepitus  bilaterally. ?No visible deformities. ?No bony tenderness.  ? ?Radiographs: None ?Assessment:  ? ?1. Onychomycosis due to dermatophyte   ?2. Nail fungus   ? ? ?Plan:  ?Patient was evaluated and treated and all questions answered. ? ?Bilateral onychomycosis of all the digits x8~third round ?-Clinically all the nails have resolved completely.  At this time there is only little bit of residual pain left on the second digit of the right foot.  For this I discussed with her she will benefit from topical medication.  She states understand like to proceed with Penlac.  I have asked her to apply twice a day she states understanding ?-Penlac was sent to the pharmacy ? ? ?No follow-ups on file. ?

## 2022-03-02 DIAGNOSIS — M5416 Radiculopathy, lumbar region: Secondary | ICD-10-CM | POA: Diagnosis not present

## 2022-03-02 DIAGNOSIS — M9902 Segmental and somatic dysfunction of thoracic region: Secondary | ICD-10-CM | POA: Diagnosis not present

## 2022-03-02 DIAGNOSIS — M955 Acquired deformity of pelvis: Secondary | ICD-10-CM | POA: Diagnosis not present

## 2022-03-02 DIAGNOSIS — M9903 Segmental and somatic dysfunction of lumbar region: Secondary | ICD-10-CM | POA: Diagnosis not present

## 2022-03-02 DIAGNOSIS — M9905 Segmental and somatic dysfunction of pelvic region: Secondary | ICD-10-CM | POA: Diagnosis not present

## 2022-03-02 DIAGNOSIS — M62830 Muscle spasm of back: Secondary | ICD-10-CM | POA: Diagnosis not present

## 2022-03-16 ENCOUNTER — Ambulatory Visit (INDEPENDENT_AMBULATORY_CARE_PROVIDER_SITE_OTHER): Payer: No Typology Code available for payment source | Admitting: Psychiatry

## 2022-03-16 ENCOUNTER — Encounter: Payer: Self-pay | Admitting: Psychiatry

## 2022-03-16 DIAGNOSIS — F411 Generalized anxiety disorder: Secondary | ICD-10-CM | POA: Diagnosis not present

## 2022-03-16 NOTE — Progress Notes (Signed)
[facility] Counselor Initial Adult Exam ? ?Name: Ellen Castillo ?Date: 03/16/2022 ?MRN: [id number] ?DOB: 17-Jan-1996 ?PCP: Duanne Limerick, MD ? ?Time spent: 55 minutes start time 12:05 PM end time 1 PM ? ? ?Guardian/Payee:  patient   ? ?Paperwork requested:  Yes  ? ?Reason for Visit /Presenting Problem: Patient was present for session.  She shared she has thought about therapy for a while because she struggles with anxiety due to being hard on herself.  She shared she is a mom  and she is realizing her significant other has mental health issues and she takes it on herself a lot.  She reported that she is a people pleaser.  She has high standards for herself and boundaries so this is her first long term relationship. She  has been holding things in for a long time and she wants to learn new ways of coping.  She shared that she should be able to go to her partner but she doesn't feel she can do that with his issues. She reported that her partner is working on it. She feels that she holds so much in she gets overwhelmed and has to be quiet and breath due to feeling a panic state. She stated she is getting back into going to the gym and that has helped her, encouraged her to continue that.Parents had people move in and she is not crazy about them.They have caused problems for the family. She was called "rude" by the people due to not speaking to them individually. She is also seeing that with her dad ,she will always be wrong and he will always be right. She reported she feels her mother is pulling from her due to the situation and that is very difficult because her mother had been her support person. She reported feeling like a single mother in a relationship.Patient reported she is the mediator in her family and it has been hard for her due to her dad having affairs. She has felt pressure to make the decision about their marriage, which she did not feel able to make. She was at [facility] at that time when her mother was  tired of the affairs and she had to take a leave of absence due to the pressure with the situation.  Patient stated she wants to be able to stay with her partner for her son and she wants him to be able to see how to resolve issues appropriately.  Encouraged patient to think through what she would like to see happen in treatment so that goals and treatment plan can be developed at next session. ? ?Mental Status Exam: ?  ? ?Appearance:   Well Groomed     ?Behavior:  Appropriate  ?Motor:  Normal  ?Speech/Language:   Normal Rate  ?Affect:  Appropriate tearful  ?Mood:  anxious  ?Thought process:  normal  ?Thought content:    WNL  ?Sensory/Perceptual disturbances:    WNL  ?Orientation:  oriented to person, place, time/date, and situation  ?Attention:  Good  ?Concentration:  Good  ?Memory:  WNL  ?Fund of knowledge:   Good  ?Insight:    Good  ?Judgment:   Good  ?Impulse Control:  Good  ? ?Reported Symptoms:  sadness, tearful, anxiety, rumination, triggered responses, memory issues,sleep issues, panic ? ?Risk Assessment: ?Danger to Self:  No ?Self-injurious Behavior: No ?Danger to Others: No ?Duty to Warn:no ?Physical Aggression / Violence:No  ?Access to Firearms a concern: No  ?Gang Involvement:No  ?Patient / guardian  was educated about steps to take if suicide or homicide risk level increases between visits: yes ?While future psychiatric events cannot be accurately predicted, the patient does not currently require acute inpatient psychiatric care and does not currently meet [facility] involuntary commitment criteria. ? ?Substance Abuse History: ?Current substance abuse: No    ? ?Past Psychiatric History:   ?No previous psychological problems have been observed ?Outpatient Providers:none ?History of Psych Hospitalization: No  ?Psychological Testing:  none   ? ?Abuse History: ?Victim of No.,  none    ?Report needed: No. ?Victim of Neglect:No. ?Perpetrator of  none   ?Witness / Exposure to Domestic Violence: No    ?Protective Services Involvement: No  ?Witness to [employer] Violence:  No  ? ?Family History:  ?Family History  ?Problem Relation Age of Onset  ? Hyperlipidemia Father   ? ? ?Living situation: the patient lives with their family ? ?Sexual Orientation:  Straight ? ?Relationship Status: single  ?Name of spouse / other:partner ?            If a parent, number of children / ages:[place] 2 ? ?Support Systems; mother sister ? ?Financial Stress:  Yes  ? ?Income/Employment/Disability: Supported by [facility] [employer] ? ?Military Service: No  ? ?Educational History: ?Education: some college ? ?Religion/Sprituality/World View:    none ? ?Any cultural differences that may affect / interfere with treatment:  not applicable  ? ?Recreation/Hobbies: exercise ? ?Stressors:Marital or family conflict   ? ?Strengths:  Family and Hopefulness ? ?Barriers:  none  ? ?Legal History: ?Pending legal issue / charges: The patient has no significant history of legal issues. ?History of legal issue / charges:  none ? ?Medical History/Surgical History:reviewed ?Past Medical History:  ?Diagnosis Date  ? Allergy   ? GERD (gastroesophageal reflux disease)   ? ? ?Past Surgical History:  ?Procedure Laterality Date  ? MOUTH SURGERY    ? NO PAST SURGERIES    ? ? ?Medications: ?Current Outpatient Medications  ?Medication Sig Dispense Refill  ? ciclopirox (PENLAC) 8 % solution Apply topically at bedtime. Apply over nail and surrounding skin. Apply daily over previous coat. After seven (7) days, may remove with alcohol and continue cycle. 6.6 mL 0  ? phentermine (ADIPEX-P) 37.5 MG tablet Take 37.5 mg by mouth daily.    ? sertraline (ZOLOFT) 50 MG tablet Take 1 tablet (50 mg total) by mouth daily. 30 tablet 2  ? ?No current facility-administered medications for this visit.  ? ? ?No Known Allergies ? ?Diagnoses:  ?  ICD-10-CM   ?1. Generalized anxiety disorder  F41.1   ?  ? ? ?Plan of Care: Patient is to think through goals for treatment to be discussed at  next session.  Patient is to continue exercising to release negative emotions appropriately. ? ? ?Stevphen Meuse, [facility]  ? ? ? ?

## 2022-03-22 ENCOUNTER — Other Ambulatory Visit: Payer: Self-pay | Admitting: Adult Health

## 2022-03-22 DIAGNOSIS — F411 Generalized anxiety disorder: Secondary | ICD-10-CM

## 2022-03-22 NOTE — Telephone Encounter (Signed)
Please call to schedule an appt  

## 2022-03-23 NOTE — Telephone Encounter (Signed)
Lvm for pt to schedule

## 2022-03-31 NOTE — Telephone Encounter (Signed)
LVM to schedule appt

## 2022-04-01 NOTE — Telephone Encounter (Signed)
Refuse medication.

## 2022-04-06 DIAGNOSIS — M5416 Radiculopathy, lumbar region: Secondary | ICD-10-CM | POA: Diagnosis not present

## 2022-04-06 DIAGNOSIS — M955 Acquired deformity of pelvis: Secondary | ICD-10-CM | POA: Diagnosis not present

## 2022-04-06 DIAGNOSIS — M9905 Segmental and somatic dysfunction of pelvic region: Secondary | ICD-10-CM | POA: Diagnosis not present

## 2022-04-06 DIAGNOSIS — M9903 Segmental and somatic dysfunction of lumbar region: Secondary | ICD-10-CM | POA: Diagnosis not present

## 2022-04-06 DIAGNOSIS — M9902 Segmental and somatic dysfunction of thoracic region: Secondary | ICD-10-CM | POA: Diagnosis not present

## 2022-04-06 DIAGNOSIS — M62830 Muscle spasm of back: Secondary | ICD-10-CM | POA: Diagnosis not present

## 2022-05-11 ENCOUNTER — Ambulatory Visit (INDEPENDENT_AMBULATORY_CARE_PROVIDER_SITE_OTHER): Payer: No Typology Code available for payment source | Admitting: Psychiatry

## 2022-05-11 DIAGNOSIS — F411 Generalized anxiety disorder: Secondary | ICD-10-CM

## 2022-05-11 NOTE — Progress Notes (Signed)
      [facility] Counselor/Therapist Progress Note  Patient ID: Ellen Castillo, MRN: [id number],    Date: 05/11/2022  Time Spent: 60 minutes start time 1:08 PM  end time 2:08 PM  Treatment Type: Individual Therapy  Reported Symptoms: anxiety, sadness, crying spells, triggered responses, sleep issues, rumination  Mental Status Exam:  Appearance:   Casual and Neat     Behavior:  Appropriate  Motor:  Normal  Speech/Language:   Normal Rate  Affect:  Appropriate tearful  Mood:  anxious sadness  Thought process:  normal  Thought content:    WNL  Sensory/Perceptual disturbances:    WNL  Orientation:  oriented to person, place, time/date, and situation  Attention:  Good  Concentration:  Good  Memory:  WNL  Fund of knowledge:   Good  Insight:    Good  Judgment:   Good  Impulse Control:  Good   Risk Assessment: Danger to Self:  No Self-injurious Behavior: No Danger to Others: No Duty to Warn:no Physical Aggression / Violence:No  Access to Firearms a concern: No  Gang Involvement:No   Subjective: Patient was present for session. She shared that she her father entertains women other than her mother and that has made it hard to trust men. She shared that she was in college when her parents almost broke up. She shared that her boyfriend has started to talking to women on line. She shared she confronted him and she shared she was able to set boundaries with him on that issue.  Patient was encouraged to feel good about the fact that she is changing the pattern in her relationships.  Patient shared she was able to acknowledge that she is enough on her own and she can make it as a independent woman if need be.  Patient was encouraged to remind herself of that regularly and to remind herself of all the resources that she has so that she can be okay regardless of the situation.  Patient was able to develop treatment plan in session.  Patient was taught a few grounding exercises that she can  utilize as needed.  She was also encouraged to research more information on EMDR and brain spotting.  Interventions: Insight-Oriented and coping skills  Diagnosis:   ICD-10-CM   1. Generalized anxiety disorder  F41.1       Plan: Patient is to practice grounding exercises discussed in session.  Patient is to affirm herself regularly that she is enough and she can maintain as an independent woman.  Patient is to continue setting limits with her boyfriend as needed.  Patient is to exercise regularly to release negative emotions appropriately.  Patient is to take medication as directed.  Stevphen Meuse, [facility]

## 2022-05-12 DIAGNOSIS — M5416 Radiculopathy, lumbar region: Secondary | ICD-10-CM | POA: Diagnosis not present

## 2022-05-12 DIAGNOSIS — M9905 Segmental and somatic dysfunction of pelvic region: Secondary | ICD-10-CM | POA: Diagnosis not present

## 2022-05-12 DIAGNOSIS — M62830 Muscle spasm of back: Secondary | ICD-10-CM | POA: Diagnosis not present

## 2022-05-12 DIAGNOSIS — M9902 Segmental and somatic dysfunction of thoracic region: Secondary | ICD-10-CM | POA: Diagnosis not present

## 2022-05-12 DIAGNOSIS — M9903 Segmental and somatic dysfunction of lumbar region: Secondary | ICD-10-CM | POA: Diagnosis not present

## 2022-05-12 DIAGNOSIS — M955 Acquired deformity of pelvis: Secondary | ICD-10-CM | POA: Diagnosis not present

## 2022-06-03 ENCOUNTER — Ambulatory Visit: Payer: No Typology Code available for payment source | Admitting: Psychiatry

## 2022-06-24 ENCOUNTER — Ambulatory Visit: Payer: No Typology Code available for payment source | Admitting: Psychiatry

## 2022-07-23 ENCOUNTER — Ambulatory Visit: Payer: No Typology Code available for payment source | Admitting: Psychiatry

## 2022-08-02 DIAGNOSIS — H6122 Impacted cerumen, left ear: Secondary | ICD-10-CM | POA: Diagnosis not present

## 2022-08-02 DIAGNOSIS — H6692 Otitis media, unspecified, left ear: Secondary | ICD-10-CM | POA: Diagnosis not present

## 2022-08-19 ENCOUNTER — Ambulatory Visit: Payer: No Typology Code available for payment source | Admitting: Psychiatry

## 2022-09-15 ENCOUNTER — Ambulatory Visit: Payer: No Typology Code available for payment source | Admitting: Family Medicine

## 2022-09-16 ENCOUNTER — Ambulatory Visit: Payer: Self-pay

## 2022-09-16 ENCOUNTER — Encounter: Payer: Self-pay | Admitting: Emergency Medicine

## 2022-09-16 ENCOUNTER — Ambulatory Visit
Admission: EM | Admit: 2022-09-16 | Discharge: 2022-09-16 | Disposition: A | Discharge status: Home / Self Care | Payer: No Typology Code available for payment source

## 2022-09-16 DIAGNOSIS — L03012 Cellulitis of left finger: Secondary | ICD-10-CM

## 2022-09-16 DIAGNOSIS — S6992XA Unspecified injury of left wrist, hand and finger(s), initial encounter: Secondary | ICD-10-CM

## 2022-09-16 MED ORDER — MUPIROCIN 2 % EX OINT: 1.0000 | TOPICAL_OINTMENT | Freq: Two times a day (BID) | CUTANEOUS | 0 refills | Status: DC

## 2022-09-16 MED ORDER — CEFDINIR 300 MG PO CAPS: 300.0000 mg | ORAL_CAPSULE | Freq: Two times a day (BID) | ORAL | 0 refills | Status: AC

## 2022-09-16 NOTE — ED Provider Notes (Signed)
[facility]    CSN: [id number] Arrival date & time: 09/16/22  1108      History   Chief Complaint Chief Complaint  Patient presents with   Finger Injury    HPI Ellen Castillo is a 26 y.o. female presenting for injury of the left fifth fingernail.  Patient reports that she just had full nails put on a couple of days ago.  She reports that she had the nail break when she tried to catch something that her son threw.  She says there was a little bit of bleeding under the nail.  She reports that she has tried to clean it.  There is some swelling and redness around the tip of the finger.  She is not complaining of any bony pain.  She is afraid of the nail being broken underneath the false nail.  She has not had any fevers.  No numbness or weakness.  No other complaints.  HPI  Past Medical History:  Diagnosis Date   Allergy    GERD (gastroesophageal reflux disease)     Patient Active Problem List   Diagnosis Date Noted   Labor and delivery indication for care or intervention 11/20/2019   Supervision of normal first pregnancy in third trimester 05/24/2019    Past Surgical History:  Procedure Laterality Date   MOUTH SURGERY     NO PAST SURGERIES      OB History     Gravida  1   Para  1   Term  1   Preterm      AB      Living  1      SAB      IAB      Ectopic      Multiple  0   Live Births  1            Home Medications    Prior to Admission medications   Medication Sig Start Date End Date Taking? Authorizing Provider  cefdinir (OMNICEF) 300 MG capsule Take 1 capsule (300 mg total) by mouth 2 (two) times daily for 5 days. 09/16/22 09/21/22 Yes Shirlee Latch, PA-C  levonorgestrel (LILETTA, 52 MG,) 20.1 MCG/DAY IUD IUD 1 each by Intrauterine route once.   Yes [provider]  mupirocin ointment (BACTROBAN) 2 % Apply 1 Application topically 2 (two) times daily. 09/16/22  Yes Eusebio Friendly B, PA-C  ciclopirox (PENLAC) 8 %  solution Apply topically at bedtime. Apply over nail and surrounding skin. Apply daily over previous coat. After seven (7) days, may remove with alcohol and continue cycle. 02/17/22   Candelaria Stagers, DPM  phentermine (ADIPEX-P) 37.5 MG tablet Take 37.5 mg by mouth daily. 11/27/21   [provider]  sertraline (ZOLOFT) 50 MG tablet Take 1 tablet (50 mg total) by mouth daily. 12/08/21   Mozingo, Thereasa Solo, NP    Family History Family History  Problem Relation Age of Onset   Hyperlipidemia Father     Social History Social History   Tobacco Use   Smoking status: Never   Smokeless tobacco: Never  Vaping Use   Vaping Use: Never used  Substance Use Topics   Alcohol use: Yes    Comment: occasionally   Drug use: No     Allergies   Patient has no known allergies.   Review of Systems Review of Systems  Musculoskeletal:  Negative for arthralgias and joint swelling.  Skin:  Positive for color change. Negative for wound.  Neurological:  Negative for weakness and numbness.     Physical Exam Triage Vital Signs ED Triage Vitals  Enc Vitals Group     BP      Pulse      Resp      Temp      Temp src      SpO2      Weight      Height      Head Circumference      Peak Flow      Pain Score      Pain Loc      Pain Edu?      Excl. in [place]?    No data found.  Updated Vital Signs BP 118/63 (BP Location: Right Arm)   Pulse 80   Temp 98.4 F (36.9 C) (Oral)   Resp 16   SpO2 100%      Physical Exam Vitals and nursing note reviewed.  Constitutional:      General: She is not in acute distress.    Appearance: Normal appearance. She is not ill-appearing or toxic-appearing.  HENT:     Head: Normocephalic and atraumatic.  Eyes:     General: No scleral icterus.       Right eye: No discharge.        Left eye: No discharge.     Conjunctiva/sclera: Conjunctivae normal.  Cardiovascular:     Rate and Rhythm: Normal rate.     Pulses: Normal pulses.  Pulmonary:      Effort: Pulmonary effort is normal. No respiratory distress.  Musculoskeletal:     Cervical back: Neck supple.  Skin:    General: Skin is dry.     Comments: LEFT HAND: Patient has false nails. There is minimal crusting of dried blood. Mild erythema and increased warmth of distal 5th finger. TTP of fingernail. It is still attached to nail bed   Neurological:     General: No focal deficit present.     Mental Status: She is alert. Mental status is at baseline.     Motor: No weakness.     Gait: Gait normal.  Psychiatric:        Mood and Affect: Mood normal.        Behavior: Behavior normal.        Thought Content: Thought content normal.      UC Treatments / Results  Labs (all labs ordered are listed, but only abnormal results are displayed) Labs Reviewed - No data to display  EKG   Radiology No results found.  Procedures Procedures (including critical care time)  Medications Ordered in UC Medications - No data to display  Initial Impression / Assessment and Plan / UC Course  I have reviewed the triage vital signs and the nursing notes.  Pertinent labs & imaging results that were available during my care of the patient were reviewed by me and considered in my medical decision making (see chart for details).   26 year old female presenting for injury of the left fifth false nail that occurred yesterday when she went to catch something.  She had minimal bleeding under the nail which she has cleaned.  On exam there is some swelling and erythema of the distal finger.  There is minimal dried crusted blood.  The nail still appears to be attached to the nailbed.  It may be slightly loose distally.  Reviewed wound care techniques with patient.  Sent mupirocin ointment to pharmacy.  Also sent to cefdinir as a  look like she may have mild cellulitis.  Supportive care.  Reviewed return precautions.   Final Clinical Impressions(s) / UC Diagnoses   Final diagnoses:  Injury to fingernail  of left hand, initial encounter  Cellulitis of finger of left hand     Discharge Instructions      -Clean under the nail twice a day with antiseptic or salt water.  Please, under the nail and bandage it up so that it is protected and the nail does not grow back again. - You may have the start of a minor infection since the skin is a little red.  Have sent antibiotics for couple days the pharmacy as well as an ointment. - Tylenol and or Motrin as needed for pain relief. - Return as needed.     ED Prescriptions     Medication Sig Dispense Auth. Provider   cefdinir (OMNICEF) 300 MG capsule Take 1 capsule (300 mg total) by mouth 2 (two) times daily for 5 days. 10 capsule Eusebio Friendly B, PA-C   mupirocin ointment (BACTROBAN) 2 % Apply 1 Application topically 2 (two) times daily. 22 g Shirlee Latch, PA-C      PDMP not reviewed this encounter.   Shirlee Latch, PA-C 09/16/22 1206

## 2022-09-16 NOTE — Discharge Instructions (Addendum)
-  Clean under the nail twice a day with antiseptic or salt water.  Please, under the nail and bandage it up so that it is protected and the nail does not grow back again. - You may have the start of a minor infection since the skin is a little red.  Have sent antibiotics for couple days the pharmacy as well as an ointment. - Tylenol and or Motrin as needed for pain relief. - Return as needed.

## 2022-09-16 NOTE — ED Triage Notes (Signed)
Pt hit her left pinky finger and broke her nail 2 days ago

## 2022-12-25 DIAGNOSIS — Z1331 Encounter for screening for depression: Secondary | ICD-10-CM | POA: Diagnosis not present

## 2022-12-25 DIAGNOSIS — Z01419 Encounter for gynecological examination (general) (routine) without abnormal findings: Secondary | ICD-10-CM | POA: Diagnosis not present

## 2023-03-31 DIAGNOSIS — M9902 Segmental and somatic dysfunction of thoracic region: Secondary | ICD-10-CM | POA: Diagnosis not present

## 2023-03-31 DIAGNOSIS — M955 Acquired deformity of pelvis: Secondary | ICD-10-CM | POA: Diagnosis not present

## 2023-03-31 DIAGNOSIS — M9905 Segmental and somatic dysfunction of pelvic region: Secondary | ICD-10-CM | POA: Diagnosis not present

## 2023-03-31 DIAGNOSIS — M9903 Segmental and somatic dysfunction of lumbar region: Secondary | ICD-10-CM | POA: Diagnosis not present

## 2023-03-31 DIAGNOSIS — M5416 Radiculopathy, lumbar region: Secondary | ICD-10-CM | POA: Diagnosis not present

## 2023-03-31 DIAGNOSIS — M62830 Muscle spasm of back: Secondary | ICD-10-CM | POA: Diagnosis not present

## 2023-07-19 DIAGNOSIS — Z30432 Encounter for removal of intrauterine contraceptive device: Secondary | ICD-10-CM | POA: Diagnosis not present

## 2023-08-19 ENCOUNTER — Ambulatory Visit: Payer: No Typology Code available for payment source | Admitting: Family Medicine

## 2023-08-19 ENCOUNTER — Encounter: Payer: Self-pay | Admitting: Family Medicine

## 2023-08-19 VITALS — BP 120/80 | HR 74 | Temp 99.3°F | Ht 64.0 in | Wt 205.0 lb

## 2023-08-19 DIAGNOSIS — R051 Acute cough: Secondary | ICD-10-CM

## 2023-08-19 DIAGNOSIS — J0100 Acute maxillary sinusitis, unspecified: Secondary | ICD-10-CM | POA: Diagnosis not present

## 2023-08-19 LAB — POCT INFLUENZA A/B
Influenza A, POC: NEGATIVE
Influenza B, POC: NEGATIVE

## 2023-08-19 LAB — POC COVID19 BINAXNOW: SARS Coronavirus 2 Ag: NEGATIVE

## 2023-08-19 MED ORDER — BENZONATATE 100 MG PO CAPS
100.0000 mg | ORAL_CAPSULE | Freq: Two times a day (BID) | ORAL | 0 refills | Status: AC | PRN
Start: 2023-08-19 — End: ?

## 2023-08-19 MED ORDER — AMOXICILLIN 500 MG PO CAPS
500.0000 mg | ORAL_CAPSULE | Freq: Three times a day (TID) | ORAL | 0 refills | Status: AC
Start: 2023-08-19 — End: 2023-08-29

## 2023-08-19 NOTE — Progress Notes (Signed)
Date:  08/19/2023   Name:  Ellen Castillo   DOB:  [birth year]   MRN:  [id number]   Chief Complaint: Cough (Drainage, sinus pressure and yellow production)  Sinusitis This is a new problem. The current episode started in the past 7 days. The problem has been gradually improving since onset. There has been no fever. She is experiencing no pain. Associated symptoms include congestion. Pertinent negatives include no chills, coughing, diaphoresis, ear pain, headaches, hoarse voice, neck pain, shortness of breath, sinus pressure, sneezing, sore throat or swollen glands. Past treatments include sitting up. The treatment provided moderate relief.    No results found for: "NA", "K", "CO2", "GLUCOSE", "BUN", "CREATININE", "CALCIUM", "EGFR", "GFRNONAA" No results found for: "CHOL", "HDL", "LDLCALC", "LDLDIRECT", "TRIG", "CHOLHDL" No results found for: "TSH" No results found for: "HGBA1C" Lab Results  Component Value Date   WBC 20.2 (H) 11/21/2019   HGB 12.5 11/21/2019   HCT 35.8 (L) 11/21/2019   MCV 91.8 11/21/2019   PLT 186 11/21/2019   Lab Results  Component Value Date   ALT 11 10/16/2021   AST 16 10/16/2021   ALKPHOS 59 10/16/2021   BILITOT 1.2 10/16/2021   No results found for: "25OHVITD2", "25OHVITD3", "VD25OH"   Review of Systems  Constitutional:  Negative for chills and diaphoresis.  HENT:  Positive for congestion. Negative for ear pain, hoarse voice, sinus pressure, sneezing and sore throat.   Respiratory:  Negative for cough, chest tightness, shortness of breath and wheezing.   Cardiovascular:  Negative for chest pain, palpitations and leg swelling.  Musculoskeletal:  Negative for neck pain.  Neurological:  Negative for headaches.    Patient Active Problem List   Diagnosis Date Noted   Labor and delivery indication for care or intervention 11/20/2019   Supervision of normal first pregnancy in third trimester 05/24/2019    No Known Allergies  Past Surgical History:   Procedure Laterality Date   MOUTH SURGERY     NO PAST SURGERIES      Social History   Tobacco Use   Smoking status: Never   Smokeless tobacco: Never  Vaping Use   Vaping status: Never Used  Substance Use Topics   Alcohol use: Yes    Comment: occasionally   Drug use: No     Medication list has been reviewed and updated.  No outpatient medications have been marked as taking for the 08/19/23 encounter (Office Visit) with Duanne Limerick, MD.       08/19/2023    3:52 PM  GAD 7 : Generalized Anxiety Score  Nervous, Anxious, on Edge 0  Control/stop worrying 0  Worry too much - different things 0  Trouble relaxing 0  Restless 0  Easily annoyed or irritable 0  Afraid - awful might happen 0  Total GAD 7 Score 0  Anxiety Difficulty Not difficult at all       08/19/2023    3:52 PM 12/02/2020   11:46 AM  Depression screen PHQ 2/9  Decreased Interest 0 0  Down, Depressed, Hopeless 0 0  PHQ - 2 Score 0 0  Altered sleeping 0   Tired, decreased energy 0   Change in appetite 0   Feeling bad or failure about yourself  0   Trouble concentrating 0   Moving slowly or fidgety/restless 0   Suicidal thoughts 0   PHQ-9 Score 0   Difficult doing work/chores Not difficult at all     BP Readings from Last 3 Encounters:  08/19/23 120/80  09/16/22 118/63  12/02/20 120/80    Physical Exam Vitals and nursing note reviewed. Exam conducted with a chaperone present.  Constitutional:      General: She is not in acute distress.    Appearance: She is not diaphoretic.  HENT:     Head: Normocephalic and atraumatic.     Right Ear: Tympanic membrane and external ear normal.     Left Ear: Tympanic membrane and external ear normal.     Nose: Nose normal. No congestion or rhinorrhea.  Eyes:     General:        Right eye: No discharge.        Left eye: No discharge.     Conjunctiva/sclera: Conjunctivae normal.     Pupils: Pupils are equal, round, and reactive to light.  Neck:      Thyroid: No thyromegaly.     Vascular: No JVD.  Cardiovascular:     Rate and Rhythm: Normal rate and regular rhythm.     Heart sounds: Normal heart sounds. No murmur heard.    No friction rub. No gallop.  Pulmonary:     Effort: Pulmonary effort is normal.     Breath sounds: Normal breath sounds.  Abdominal:     General: Bowel sounds are normal.     Palpations: Abdomen is soft. There is no mass.     Tenderness: There is no abdominal tenderness. There is no guarding or rebound.  Musculoskeletal:        General: Normal range of motion.     Cervical back: Normal range of motion and neck supple.  Lymphadenopathy:     Cervical: No cervical adenopathy.  Skin:    General: Skin is warm and dry.  Neurological:     Mental Status: She is alert.     Wt Readings from Last 3 Encounters:  08/19/23 205 lb (93 kg)  12/02/20 203 lb (92.1 kg)  11/20/19 207 lb (93.9 kg)    BP 120/80   Pulse 74   Temp 99.3 F (37.4 C) (Oral)   Ht 5\' 4"  (1.626 m)   Wt 205 lb (93 kg)   LMP 07/31/2023 (Exact Date)   SpO2 98%   BMI 35.19 kg/m   Assessment and Plan: 1. Acute cough New onset.  Episodic.  Productive of nasal discharge.  We will treat cough with Tessalon Perles patient was evaluated for influenza COVID and she was negative. - POCT Influenza A/B - POC COVID-19 - benzonatate (TESSALON) 100 MG capsule; Take 1 capsule (100 mg total) by mouth 2 (two) times daily as needed for cough.  Dispense: 20 capsule; Refill: 0  2. Acute maxillary sinusitis, recurrence not specified Acute.  Persistent.  Relatively stable but unrelenting.  Mild tenderness is noted over the maxillary sinuses as well as paranasal.  Will treat with amoxicillin 500 mg 3 times a day for 10 days. - amoxicillin (AMOXIL) 500 MG capsule; Take 1 capsule (500 mg total) by mouth 3 (three) times daily for 10 days.  Dispense: 30 capsule; Refill: 0     Elizabeth Sauer, MD

## 2023-08-23 DIAGNOSIS — L218 Other seborrheic dermatitis: Secondary | ICD-10-CM | POA: Diagnosis not present

## 2024-02-21 DIAGNOSIS — L218 Other seborrheic dermatitis: Secondary | ICD-10-CM | POA: Diagnosis not present

## 2024-05-08 DIAGNOSIS — Z3202 Encounter for pregnancy test, result negative: Secondary | ICD-10-CM | POA: Diagnosis not present

## 2024-05-08 DIAGNOSIS — Z30011 Encounter for initial prescription of contraceptive pills: Secondary | ICD-10-CM | POA: Diagnosis not present

## 2024-05-08 DIAGNOSIS — Z01419 Encounter for gynecological examination (general) (routine) without abnormal findings: Secondary | ICD-10-CM | POA: Diagnosis not present

## 2024-05-08 DIAGNOSIS — Z1331 Encounter for screening for depression: Secondary | ICD-10-CM | POA: Diagnosis not present

## 2024-07-13 DIAGNOSIS — L02412 Cutaneous abscess of left axilla: Secondary | ICD-10-CM | POA: Diagnosis not present

## 2024-07-26 DIAGNOSIS — M722 Plantar fascial fibromatosis: Secondary | ICD-10-CM | POA: Diagnosis not present

## 2024-07-27 ENCOUNTER — Ambulatory Visit: Admitting: Podiatry

## 2024-09-12 DIAGNOSIS — B9689 Other specified bacterial agents as the cause of diseases classified elsewhere: Secondary | ICD-10-CM | POA: Diagnosis not present

## 2024-09-12 DIAGNOSIS — J019 Acute sinusitis, unspecified: Secondary | ICD-10-CM | POA: Diagnosis not present
